# Patient Record
Sex: Female | Born: 1955
Health system: Southern US, Community
[De-identification: ages and names within clinical notes are randomized; demographics above are authoritative.]

## PROBLEM LIST (undated history)

## (undated) DIAGNOSIS — M1990 Unspecified osteoarthritis, unspecified site: Secondary | ICD-10-CM

## (undated) DIAGNOSIS — T7840XA Allergy, unspecified, initial encounter: Secondary | ICD-10-CM

## (undated) DIAGNOSIS — E669 Obesity, unspecified: Secondary | ICD-10-CM

## (undated) DIAGNOSIS — M67431 Ganglion, right wrist: Secondary | ICD-10-CM

## (undated) DIAGNOSIS — F419 Anxiety disorder, unspecified: Secondary | ICD-10-CM

## (undated) DIAGNOSIS — K219 Gastro-esophageal reflux disease without esophagitis: Secondary | ICD-10-CM

## (undated) DIAGNOSIS — I499 Cardiac arrhythmia, unspecified: Secondary | ICD-10-CM

## (undated) DIAGNOSIS — D649 Anemia, unspecified: Secondary | ICD-10-CM

## (undated) DIAGNOSIS — J302 Other seasonal allergic rhinitis: Secondary | ICD-10-CM

## (undated) HISTORY — DX: Obesity, unspecified: E66.9

## (undated) HISTORY — PX: COLON SURGERY: SHX602

## (undated) HISTORY — DX: Unspecified osteoarthritis, unspecified site: M19.90

## (undated) HISTORY — DX: Allergy, unspecified, initial encounter: T78.40XA

## (undated) HISTORY — DX: Cardiac arrhythmia, unspecified: I49.9

## (undated) HISTORY — DX: Gastro-esophageal reflux disease without esophagitis: K21.9

## (undated) HISTORY — PX: DIAGNOSTIC LAPAROSCOPY: SUR761

## (undated) HISTORY — PX: TUBAL LIGATION: SHX77

---

## 1989-11-13 HISTORY — PX: ABDOMINAL HYSTERECTOMY: SHX81

## 1998-07-28 ENCOUNTER — Other Ambulatory Visit: Admission: RE | Admit: 1998-07-28 | Discharge: 1998-07-28 | Payer: Self-pay | Admitting: Gynecology

## 2000-12-27 ENCOUNTER — Encounter: Admission: RE | Admit: 2000-12-27 | Discharge: 2000-12-27 | Payer: Self-pay | Admitting: Family Medicine

## 2000-12-27 ENCOUNTER — Encounter: Payer: Self-pay | Admitting: Family Medicine

## 2004-12-23 ENCOUNTER — Ambulatory Visit (HOSPITAL_COMMUNITY): Admission: RE | Admit: 2004-12-23 | Discharge: 2004-12-23 | Payer: Self-pay | Admitting: Family Medicine

## 2011-12-26 ENCOUNTER — Other Ambulatory Visit: Payer: Self-pay | Admitting: Gastroenterology

## 2011-12-26 DIAGNOSIS — R1012 Left upper quadrant pain: Secondary | ICD-10-CM

## 2011-12-29 ENCOUNTER — Ambulatory Visit
Admission: RE | Admit: 2011-12-29 | Discharge: 2011-12-29 | Disposition: A | Discharge status: Home / Self Care | Payer: BC Managed Care – PPO | Source: Ambulatory Visit | Attending: Gastroenterology | Admitting: Gastroenterology

## 2011-12-29 DIAGNOSIS — R1012 Left upper quadrant pain: Secondary | ICD-10-CM

## 2011-12-29 MED ORDER — IOHEXOL 300 MG/ML  SOLN
100.0000 mL | Freq: Once | INTRAMUSCULAR | Status: AC | PRN
  Administered 2011-12-29: 100 mL via INTRAVENOUS

## 2013-05-05 ENCOUNTER — Other Ambulatory Visit: Payer: Self-pay | Admitting: Family Medicine

## 2013-05-05 DIAGNOSIS — J329 Chronic sinusitis, unspecified: Secondary | ICD-10-CM

## 2013-05-06 ENCOUNTER — Ambulatory Visit
Admission: RE | Admit: 2013-05-06 | Discharge: 2013-05-06 | Disposition: A | Discharge status: Home / Self Care | Payer: BC Managed Care – PPO | Source: Ambulatory Visit | Attending: Family Medicine | Admitting: Family Medicine

## 2013-05-06 DIAGNOSIS — J329 Chronic sinusitis, unspecified: Secondary | ICD-10-CM

## 2013-09-23 ENCOUNTER — Other Ambulatory Visit: Payer: Self-pay | Admitting: Family Medicine

## 2013-09-23 ENCOUNTER — Ambulatory Visit
Admission: RE | Admit: 2013-09-23 | Discharge: 2013-09-23 | Disposition: A | Discharge status: Home / Self Care | Payer: BC Managed Care – PPO | Source: Ambulatory Visit | Attending: Family Medicine | Admitting: Family Medicine

## 2013-09-23 DIAGNOSIS — M79671 Pain in right foot: Secondary | ICD-10-CM

## 2013-09-23 DIAGNOSIS — M79672 Pain in left foot: Secondary | ICD-10-CM

## 2013-12-16 ENCOUNTER — Ambulatory Visit
Admission: RE | Admit: 2013-12-16 | Discharge: 2013-12-16 | Disposition: A | Discharge status: Home / Self Care | Payer: BC Managed Care – PPO | Source: Ambulatory Visit | Attending: Family Medicine | Admitting: Family Medicine

## 2013-12-16 ENCOUNTER — Other Ambulatory Visit: Payer: Self-pay | Admitting: Family Medicine

## 2013-12-16 DIAGNOSIS — K5792 Diverticulitis of intestine, part unspecified, without perforation or abscess without bleeding: Secondary | ICD-10-CM

## 2013-12-16 DIAGNOSIS — R1032 Left lower quadrant pain: Secondary | ICD-10-CM

## 2013-12-16 MED ORDER — IOHEXOL 300 MG/ML  SOLN
100.0000 mL | Freq: Once | INTRAMUSCULAR | Status: AC | PRN
  Administered 2013-12-16: 100 mL via INTRAVENOUS

## 2016-06-07 ENCOUNTER — Other Ambulatory Visit: Payer: Self-pay | Admitting: Otolaryngology

## 2016-06-07 DIAGNOSIS — R430 Anosmia: Secondary | ICD-10-CM

## 2016-06-14 ENCOUNTER — Other Ambulatory Visit: Payer: Self-pay

## 2016-06-17 ENCOUNTER — Other Ambulatory Visit: Payer: Self-pay | Admitting: Otolaryngology

## 2016-06-17 ENCOUNTER — Ambulatory Visit
Admission: RE | Admit: 2016-06-17 | Discharge: 2016-06-17 | Disposition: A | Discharge status: Home / Self Care | Payer: BLUE CROSS/BLUE SHIELD | Source: Ambulatory Visit | Attending: Otolaryngology | Admitting: Otolaryngology

## 2016-06-17 DIAGNOSIS — R430 Anosmia: Secondary | ICD-10-CM

## 2016-09-11 ENCOUNTER — Other Ambulatory Visit: Payer: Self-pay | Admitting: Orthopedic Surgery

## 2016-09-13 ENCOUNTER — Encounter (HOSPITAL_BASED_OUTPATIENT_CLINIC_OR_DEPARTMENT_OTHER): Payer: Self-pay | Admitting: *Deleted

## 2016-09-19 ENCOUNTER — Encounter (HOSPITAL_BASED_OUTPATIENT_CLINIC_OR_DEPARTMENT_OTHER)
Admission: RE | Disposition: A | Discharge status: Home / Self Care | Payer: Self-pay | Source: Ambulatory Visit | Attending: Orthopedic Surgery

## 2016-09-19 ENCOUNTER — Ambulatory Visit (HOSPITAL_BASED_OUTPATIENT_CLINIC_OR_DEPARTMENT_OTHER): Payer: BLUE CROSS/BLUE SHIELD | Admitting: Certified Registered"

## 2016-09-19 ENCOUNTER — Encounter (HOSPITAL_BASED_OUTPATIENT_CLINIC_OR_DEPARTMENT_OTHER): Payer: Self-pay | Admitting: Certified Registered"

## 2016-09-19 ENCOUNTER — Ambulatory Visit (HOSPITAL_BASED_OUTPATIENT_CLINIC_OR_DEPARTMENT_OTHER)
Admission: RE | Admit: 2016-09-19 | Discharge: 2016-09-19 | Disposition: A | Discharge status: Home / Self Care | Payer: BLUE CROSS/BLUE SHIELD | Source: Ambulatory Visit | Attending: Orthopedic Surgery | Admitting: Orthopedic Surgery

## 2016-09-19 DIAGNOSIS — Z880 Allergy status to penicillin: Secondary | ICD-10-CM | POA: Insufficient documentation

## 2016-09-19 DIAGNOSIS — F419 Anxiety disorder, unspecified: Secondary | ICD-10-CM | POA: Diagnosis not present

## 2016-09-19 DIAGNOSIS — M67431 Ganglion, right wrist: Secondary | ICD-10-CM | POA: Insufficient documentation

## 2016-09-19 HISTORY — PX: MASS EXCISION: SHX2000

## 2016-09-19 HISTORY — DX: Other seasonal allergic rhinitis: J30.2

## 2016-09-19 HISTORY — DX: Anemia, unspecified: D64.9

## 2016-09-19 HISTORY — DX: Ganglion, right wrist: M67.431

## 2016-09-19 HISTORY — DX: Anxiety disorder, unspecified: F41.9

## 2016-09-19 SURGERY — EXCISION MASS
Anesthesia: Monitor Anesthesia Care | Site: Wrist | Laterality: Right

## 2016-09-19 MED ORDER — LIDOCAINE 2% (20 MG/ML) 5 ML SYRINGE
INTRAMUSCULAR | Status: AC
  Filled 2016-09-19: qty 5

## 2016-09-19 MED ORDER — LACTATED RINGERS IV SOLN
INTRAVENOUS | Status: DC
  Administered 2016-09-19: 13:00:00 via INTRAVENOUS

## 2016-09-19 MED ORDER — PROMETHAZINE HCL 25 MG/ML IJ SOLN: 6.2500 mg | INTRAMUSCULAR | Status: DC | PRN

## 2016-09-19 MED ORDER — OXYCODONE HCL 5 MG PO TABS: 5.0000 mg | ORAL_TABLET | Freq: Once | ORAL | Status: DC | PRN

## 2016-09-19 MED ORDER — HYDROCODONE-ACETAMINOPHEN 5-325 MG PO TABS: 1.0000 | ORAL_TABLET | Freq: Four times a day (QID) | ORAL | 0 refills | Status: DC | PRN

## 2016-09-19 MED ORDER — ONDANSETRON HCL 4 MG/2ML IJ SOLN
INTRAMUSCULAR | Status: AC
  Filled 2016-09-19: qty 2

## 2016-09-19 MED ORDER — HYDROMORPHONE HCL 1 MG/ML IJ SOLN: 0.2500 mg | INTRAMUSCULAR | Status: DC | PRN

## 2016-09-19 MED ORDER — CEFAZOLIN SODIUM-DEXTROSE 2-4 GM/100ML-% IV SOLN
INTRAVENOUS | Status: AC
  Filled 2016-09-19: qty 100

## 2016-09-19 MED ORDER — SCOPOLAMINE 1 MG/3DAYS TD PT72: 1.0000 | MEDICATED_PATCH | Freq: Once | TRANSDERMAL | Status: DC | PRN

## 2016-09-19 MED ORDER — FENTANYL CITRATE (PF) 100 MCG/2ML IJ SOLN
50.0000 ug | INTRAMUSCULAR | Status: AC | PRN
  Administered 2016-09-19: 25 ug via INTRAVENOUS
  Administered 2016-09-19: 50 ug via INTRAVENOUS
  Administered 2016-09-19: 25 ug via INTRAVENOUS

## 2016-09-19 MED ORDER — FENTANYL CITRATE (PF) 100 MCG/2ML IJ SOLN
INTRAMUSCULAR | Status: AC
  Filled 2016-09-19: qty 2

## 2016-09-19 MED ORDER — MIDAZOLAM HCL 2 MG/2ML IJ SOLN
1.0000 mg | INTRAMUSCULAR | Status: DC | PRN
  Administered 2016-09-19 (×2): 1 mg via INTRAVENOUS

## 2016-09-19 MED ORDER — VANCOMYCIN HCL IN DEXTROSE 1-5 GM/200ML-% IV SOLN
INTRAVENOUS | Status: AC
  Filled 2016-09-19: qty 200

## 2016-09-19 MED ORDER — OXYCODONE HCL 5 MG/5ML PO SOLN: 5.0000 mg | Freq: Once | ORAL | Status: DC | PRN

## 2016-09-19 MED ORDER — CHLORHEXIDINE GLUCONATE 4 % EX LIQD: 60.0000 mL | Freq: Once | CUTANEOUS | Status: DC

## 2016-09-19 MED ORDER — VANCOMYCIN HCL 1000 MG IV SOLR
INTRAVENOUS | Status: DC | PRN
  Administered 2016-09-19: 1000 mg via INTRAVENOUS

## 2016-09-19 MED ORDER — CEFAZOLIN SODIUM-DEXTROSE 2-4 GM/100ML-% IV SOLN: 2.0000 g | INTRAVENOUS | Status: DC

## 2016-09-19 MED ORDER — ONDANSETRON HCL 4 MG/2ML IJ SOLN
INTRAMUSCULAR | Status: DC | PRN
  Administered 2016-09-19: 4 mg via INTRAVENOUS

## 2016-09-19 MED ORDER — BUPIVACAINE HCL (PF) 0.25 % IJ SOLN
INTRAMUSCULAR | Status: DC | PRN
Start: 2016-09-19 — End: 2016-09-19
  Administered 2016-09-19: 4 mL

## 2016-09-19 MED ORDER — LIDOCAINE HCL (PF) 0.5 % IJ SOLN
INTRAMUSCULAR | Status: DC | PRN
  Administered 2016-09-19: 50 mL via INTRAVENOUS

## 2016-09-19 MED ORDER — MIDAZOLAM HCL 2 MG/2ML IJ SOLN
INTRAMUSCULAR | Status: AC
  Filled 2016-09-19: qty 2

## 2016-09-19 SURGICAL SUPPLY — 46 items
BANDAGE COBAN STERILE 2 (GAUZE/BANDAGES/DRESSINGS) IMPLANT
BLADE SURG 15 STRL LF DISP TIS (BLADE) ×1 IMPLANT
BLADE SURG 15 STRL SS (BLADE) ×2
BNDG CMPR 9X4 STRL LF SNTH (GAUZE/BANDAGES/DRESSINGS) ×1
BNDG COHESIVE 1X5 TAN STRL LF (GAUZE/BANDAGES/DRESSINGS) IMPLANT
BNDG COHESIVE 3X5 TAN STRL LF (GAUZE/BANDAGES/DRESSINGS) ×1 IMPLANT
BNDG ESMARK 4X9 LF (GAUZE/BANDAGES/DRESSINGS) ×1 IMPLANT
BNDG GAUZE ELAST 4 BULKY (GAUZE/BANDAGES/DRESSINGS) IMPLANT
CHLORAPREP W/TINT 26ML (MISCELLANEOUS) ×2 IMPLANT
CORDS BIPOLAR (ELECTRODE) ×2 IMPLANT
COVER BACK TABLE 60X90IN (DRAPES) ×2 IMPLANT
COVER MAYO STAND STRL (DRAPES) ×2 IMPLANT
CUFF TOURNIQUET SINGLE 18IN (TOURNIQUET CUFF) IMPLANT
DECANTER SPIKE VIAL GLASS SM (MISCELLANEOUS) IMPLANT
DRAIN PENROSE 1/2X12 LTX STRL (WOUND CARE) IMPLANT
DRAPE EXTREMITY T 121X128X90 (DRAPE) ×2 IMPLANT
DRAPE SURG 17X23 STRL (DRAPES) ×2 IMPLANT
GAUZE SPONGE 4X4 12PLY STRL (GAUZE/BANDAGES/DRESSINGS) ×2 IMPLANT
GAUZE XEROFORM 1X8 LF (GAUZE/BANDAGES/DRESSINGS) ×2 IMPLANT
GLOVE BIOGEL PI IND STRL 8.5 (GLOVE) ×1 IMPLANT
GLOVE BIOGEL PI INDICATOR 8.5 (GLOVE) ×2
GLOVE SURG ORTHO 8.0 STRL STRW (GLOVE) ×2 IMPLANT
GLOVE SURG SYN 7.5  E (GLOVE) ×2
GLOVE SURG SYN 7.5 E (GLOVE) ×2 IMPLANT
GLOVE SURG SYN 7.5 PF PI (GLOVE) IMPLANT
GOWN STRL REUS W/ TWL LRG LVL3 (GOWN DISPOSABLE) ×1 IMPLANT
GOWN STRL REUS W/TWL 2XL LVL3 (GOWN DISPOSABLE) ×1 IMPLANT
GOWN STRL REUS W/TWL LRG LVL3 (GOWN DISPOSABLE) ×2
GOWN STRL REUS W/TWL XL LVL3 (GOWN DISPOSABLE) ×2 IMPLANT
NDL PRECISIONGLIDE 27X1.5 (NEEDLE) IMPLANT
NDL SAFETY ECLIPSE 18X1.5 (NEEDLE) IMPLANT
NEEDLE HYPO 18GX1.5 SHARP (NEEDLE)
NEEDLE PRECISIONGLIDE 27X1.5 (NEEDLE) ×2 IMPLANT
NS IRRIG 1000ML POUR BTL (IV SOLUTION) ×2 IMPLANT
PACK BASIN DAY SURGERY FS (CUSTOM PROCEDURE TRAY) ×2 IMPLANT
PAD CAST 3X4 CTTN HI CHSV (CAST SUPPLIES) IMPLANT
PADDING CAST COTTON 3X4 STRL (CAST SUPPLIES) ×2
SPLINT PLASTER CAST XFAST 3X15 (CAST SUPPLIES) IMPLANT
SPLINT PLASTER XTRA FASTSET 3X (CAST SUPPLIES) ×7
STOCKINETTE 4X48 STRL (DRAPES) ×2 IMPLANT
SUT ETHILON 4 0 PS 2 18 (SUTURE) ×2 IMPLANT
SUT VIC AB 4-0 P2 18 (SUTURE) ×1 IMPLANT
SYR BULB 3OZ (MISCELLANEOUS) ×2 IMPLANT
SYR CONTROL 10ML LL (SYRINGE) ×1 IMPLANT
TOWEL OR 17X24 6PK STRL BLUE (TOWEL DISPOSABLE) ×2 IMPLANT
UNDERPAD 30X30 (UNDERPADS AND DIAPERS) ×2 IMPLANT

## 2016-09-19 NOTE — Anesthesia Postprocedure Evaluation (Signed)
Anesthesia Post Note  Patient: Lori Strickland  Procedure(s) Performed: Procedure(s) (LRB): EXCISION volar cyst right wrist (Right)  Patient location during evaluation: PACU Anesthesia Type: MAC and Bier Block Level of consciousness: awake and alert Pain management: pain level controlled Vital Signs Assessment: post-procedure vital signs reviewed and stable Respiratory status: spontaneous breathing, nonlabored ventilation, respiratory function stable and patient connected to nasal cannula oxygen Cardiovascular status: stable and blood pressure returned to baseline Anesthetic complications: no    Last Vitals:  Vitals:   09/19/16 1500 09/19/16 1515  BP: (!) 113/58 (!) 117/59  Pulse: 62 64  Resp: 14 12  Temp:      Last Pain:  Vitals:   09/19/16 1515  TempSrc:   PainSc: 0-No pain                 Biance Moncrief,JAMES TERRILL

## 2016-09-19 NOTE — Anesthesia Procedure Notes (Signed)
Procedure Name: MAC Date/Time: 09/19/2016 2:06 PM Performed by: Curly ShoresRAFT, Oanh Devivo W Pre-anesthesia Checklist: Patient identified, Emergency Drugs available, Suction available and Patient being monitored Patient Re-evaluated:Patient Re-evaluated prior to inductionOxygen Delivery Method: Simple face mask Preoxygenation: Pre-oxygenation with 100% oxygen Intubation Type: IV induction

## 2016-09-19 NOTE — Op Note (Signed)
Dictation Number [id number]

## 2016-09-19 NOTE — Discharge Instructions (Addendum)

## 2016-09-19 NOTE — H&P (Signed)
  Lori Strickland is an 60 y.o. female.   Chief Complaint: mass right wrist HPI: Lori Strickland is a 60 year old right-hand-dominant female referred by Dr. Cliffton AstersWhite for consultation regarding a mass in the volar radial aspect of her right wrist. She states this been present for 3 months. She states she recalls no history of injury to it. She states that she has been playing a can set of a moderate time out of computer game and this seems to aggravate it. She otherwise has no pain. She states that it is an aggravation more than discomfort. She has not had any treatment or tried anything for this. She states that she notices she frequently rubs the area. She has no history of diabetes thyroid problems arthritis gout. She is borderline diabetic. Family history is negative for diabetes positive for thyroid problems negative for arthritis negative for gout.      Past Medical History:  Diagnosis Date  . Anemia   . Anxiety   . Ganglion cyst of wrist, right   . Seasonal allergies     Past Surgical History:  Procedure Laterality Date  . ABDOMINAL HYSTERECTOMY  1991  . COLON SURGERY    . DIAGNOSTIC LAPAROSCOPY     cysts on ovaries  . TUBAL LIGATION      History reviewed. No pertinent family history. Social History:  reports that she has never smoked. She has never used smokeless tobacco. She reports that she does not drink alcohol or use drugs.  Allergies:  Allergies  Allergen Reactions  . Eggs Or Egg-Derived Products Swelling  . Ampicillin Itching  . Erythromycin Diarrhea    No prescriptions prior to admission.    No results found for this or any previous visit (from the past 48 hour(s)).  No results found.   Pertinent items are noted in HPI.  Height 5\' 6"  (1.676 m), weight 81.6 kg (180 lb).  General appearance: alert, cooperative and appears stated age Head: Normocephalic, without obvious abnormality Neck: no JVD Resp: clear to auscultation bilaterally Cardio: regular rate and rhythm,  S1, S2 normal, no murmur, click, rub or gallop GI: soft, non-tender; bowel sounds normal; no masses,  no organomegaly Extremities: mass right wrist Pulses: 2+ and symmetric Skin: Skin color, texture, turgor normal. No rashes or lesions Neurologic: Grossly normal Incision/Wound: na  Assessment/Plan Assessment:  1. Ganglion cyst of volar aspect of right wrist    Plan: We have discussed various treatment alternatives with her. We have discussed splinting we have discussed discussed anti-inflammatories injection or surgical excision pre peri and postoperative course are discussed along with risks and complications. She is aware that there is no guarantee to the surgery the possibility of infection recurrence injury to arteries nerves tendons and incomplete relief of symptoms and dystrophy. She is aware of a 20% recurrence rate on the palmar aspect. Is requested to proceed to have this surgically excised. Dr. Lucilla LameWhite's notes are reviewed. She is scheduled for excision volar radial wrist ganglion as an outpatient under regional general anesthesia was dictated by anesthesia.      Savannah Erbe R 09/19/2016, 12:23 PM

## 2016-09-19 NOTE — Anesthesia Preprocedure Evaluation (Addendum)
Anesthesia Evaluation  Patient identified by MRN, date of birth, ID band Patient awake    Reviewed: Allergy & Precautions, NPO status , Patient's Chart, lab work & pertinent test results  History of Anesthesia Complications Negative for: history of anesthetic complications  Airway Mallampati: II  TM Distance: >3 FB Neck ROM: Full    Dental  (+) Teeth Intact   Pulmonary neg pulmonary ROS,    breath sounds clear to auscultation       Cardiovascular negative cardio ROS   Rhythm:Regular Rate:Normal     Neuro/Psych negative neurological ROS     GI/Hepatic negative GI ROS, Neg liver ROS,   Endo/Other  negative endocrine ROS  Renal/GU negative Renal ROS     Musculoskeletal negative musculoskeletal ROS (+)   Abdominal   Peds  Hematology negative hematology ROS (+)   Anesthesia Other Findings   Reproductive/Obstetrics                            Anesthesia Physical Anesthesia Plan  ASA: I  Anesthesia Plan: MAC and Bier Block   Post-op Pain Management:    Induction: Intravenous  Airway Management Planned: Simple Face Mask and Natural Airway  Additional Equipment:   Intra-op Plan:   Post-operative Plan:   Informed Consent: I have reviewed the patients History and Physical, chart, labs and discussed the procedure including the risks, benefits and alternatives for the proposed anesthesia with the patient or authorized representative who has indicated his/her understanding and acceptance.     Plan Discussed with: CRNA  Anesthesia Plan Comments:        Anesthesia Quick Evaluation

## 2016-09-19 NOTE — Transfer of Care (Signed)
Immediate Anesthesia Transfer of Care Note  Patient: Lori Strickland  Procedure(s) Performed: Procedure(s): EXCISION volar cyst right wrist (Right)  Patient Location: PACU  Anesthesia Type:MAC and Bier block  Level of Consciousness: awake, alert , oriented and patient cooperative  Airway & Oxygen Therapy: Patient Spontanous Breathing and Patient connected to face mask oxygen  Post-op Assessment: Report given to RN, Post -op Vital signs reviewed and stable and Patient moving all extremities  Post vital signs: Reviewed and stable  Last Vitals:  Vitals:   09/19/16 1449 09/19/16 1450  BP: 118/73   Pulse: 64 64  Resp:  12  Temp:      Last Pain:  Vitals:   09/19/16 1310  TempSrc: Oral         Complications: No apparent anesthesia complications

## 2016-09-19 NOTE — H&P (Signed)
  Lori Strickland is an 60 y.o. female.   Chief Complaint: mass right wrist WNU:[id number]:Lori Strickland is a 60 year old right-hand-dominant female referred by Dr. Cliffton Strickland for consultation regarding a mass in the volar radial aspect of her right wrist. She states this been present for 3 months. She states she recalls no history of injury to it. She states that she has been playing a can set of a moderate time out of computer game and this seems to aggravate it. She otherwise has no pain. She states that it is an aggravation more than discomfort. She has not had any treatment or tried anything for this. She states that she notices she frequently rubs the area. She has no history of diabetes thyroid problems arthritis gout. She is borderline diabetic. Family history is negative for diabetes positive for thyroid problems negative for arthritis negative for gout.      Past Medical History:  Diagnosis Date  . Anemia   . Anxiety   . Ganglion cyst of wrist, right   . Seasonal allergies     Past Surgical History:  Procedure Laterality Date  . ABDOMINAL HYSTERECTOMY  1991  . COLON SURGERY    . DIAGNOSTIC LAPAROSCOPY     cysts on ovaries  . TUBAL LIGATION      History reviewed. No pertinent family history. Social History:  reports that she has never smoked. She has never used smokeless tobacco. She reports that she does not drink alcohol or use drugs.  Allergies:  Allergies  Allergen Reactions  . Eggs Or Egg-Derived Products Swelling  . Ampicillin Itching  . Erythromycin Diarrhea    No prescriptions prior to admission.    No results found for this or any previous visit (from the past 48 hour(s)).  No results found.   Pertinent items are noted in HPI.  Height 5\' 6"  (1.676 m), weight 81.6 kg (180 lb).  General appearance: alert, cooperative and appears stated age Head: Normocephalic, without obvious abnormality Neck: no JVD Resp: clear to auscultation bilaterally Cardio: regular rate and rhythm,  S1, S2 normal, no murmur, click, rub or gallop GI: soft, non-tender; bowel sounds normal; no masses,  no organomegaly Extremities: mass right wrist Pulses: 2+ and symmetric Skin: Skin color, texture, turgor normal. No rashes or lesions Neurologic: Grossly normal Incision/Wound: na  Assessment/Plan Assessment:  1. Ganglion cyst of volar aspect of right wrist l    Plan: We have discussed various treatment alternatives with her. We have discussed splinting we have discussed discussed anti-inflammatories injection or surgical excision pre peri and postoperative course are discussed along with risks and complications. She is aware that there is no guarantee to the surgery the possibility of infection recurrence injury to arteries nerves tendons and incomplete relief of symptoms and dystrophy. She is aware of a 20% recurrence rate on the palmar aspect. Is requested to proceed to have this surgically excised. Dr. Lucilla LameWhite's notes are reviewed. She is scheduled for excision volar radial wrist ganglion as an outpatient under regional general anesthesia was dictated by anesthesia.      Lori Strickland R 09/19/2016, 11:18 AM

## 2016-09-19 NOTE — Brief Op Note (Signed)
09/19/2016  2:47 PM  PATIENT:  Lori Strickland  60 y.o. female  PRE-OPERATIVE DIAGNOSIS:  volar cyst right wrist  POST-OPERATIVE DIAGNOSIS:  volar cyst right wrist  PROCEDURE:  Procedure(s): EXCISION volar cyst right wrist (Right)  SURGEON:  Surgeon(s) and Role:    * Cindee SaltGary Sophiamarie Nease, MD - Primary  PHYSICIAN ASSISTANT:   ASSISTANTS: none   ANESTHESIA:   regional and local  EBL:  Total I/O In: 500 [I.V.:500] Out: 3 [Blood:3]  BLOOD ADMINISTERED:none  DRAINS: none   LOCAL MEDICATIONS USED:  BUPIVICAINE   SPECIMEN:  Excision  DISPOSITION OF SPECIMEN:  PATHOLOGY  COUNTS:  YES  TOURNIQUET:   Total Tourniquet Time Documented: Upper Arm (Right) - 31 minutes Total: Upper Arm (Right) - 31 minutes   DICTATION: .Other Dictation: Dictation Number [id number]  PLAN OF CARE: Discharge to home after PACU  PATIENT DISPOSITION:  PACU - hemodynamically stable.

## 2016-09-20 ENCOUNTER — Encounter (HOSPITAL_BASED_OUTPATIENT_CLINIC_OR_DEPARTMENT_OTHER): Payer: Self-pay | Admitting: Orthopedic Surgery

## 2016-09-20 NOTE — Op Note (Signed)
NAMMurlean Hark:  Engebretson, ELLEN                 ACCOUNT NO.:  [id number]  MEDICAL RECORD NO.:  [id number]  LOCATION:                                 FACILITY:  PHYSICIAN:  Cindee SaltGary Beverlee Wilmarth, M.D.            DATE OF BIRTH:  DATE OF PROCEDURE:  09/19/2016 DATE OF DISCHARGE:                              OPERATIVE REPORT   PREOPERATIVE DIAGNOSIS:  Volar radial wrist ganglion, right wrist.  POSTOPERATIVE DIAGNOSIS:  Volar radial wrist ganglion, right wrist.  OPERATION:  Excision volar radial wrist ganglion, right wrist.  SURGEON:  Cindee SaltGary Kendricks Reap, M.D.  ANESTHESIA:  Upper arm IV regional with local infiltration.  ANESTHESIOLOGIST:  Burna FortsJames T. Massagee, M.D.  HISTORY:  The patient is a 60 year old female with a history of a mass on the volar radial aspect of the right wrist which she desires having removed.  She is aware of risks and complications including infection; recurrence of injury to arteries, nerves, tendons; incomplete relief of symptoms and dystrophy.  In the preoperative area, the patient is seen, the extremity marked by both patient and surgeon, and antibiotic given.  PROCEDURE IN DETAIL:  The patient was brought to the operating room, where an upper arm IV regional anesthetic was carried out without difficulty under the direction of Dr. Jacklynn BueMassagee.  She was prepped using ChloraPrep in a supine position with the right arm free.  A 3-minute dry time was allowed and time-out taken, confirming the patient and procedure.  The incision was made on the volar radial aspect of the right wrist and carried down through subcutaneous tissue.  Bleeders were electrocauterized with bipolar.  The radial artery was identified with a cyst in the interval between the proper radial artery and the volar branch.  With blunt and sharp dissection, this was carefully dissected free.  Bleeders were electrocauterized as necessary with bipolar. Primarily, these were vena comitans.  The cyst was followed into  the radiocarpal joint.  This area was opened.  The entire cyst was sent to Pathology.  The area was coagulated where the cyst arose.  The wound was copiously irrigated with saline.  The subcutaneous tissue was closed with interrupted 4-0 Vicryl sutures.  The skin was then closed with interrupted 4-0 nylon sutures.  A local infiltration with 0.25% bupivacaine without epinephrine was given, approximately 5 mL was used.  A sterile compressive dressing and volar splint were applied.  On deflation of the tourniquet, all fingers immediately pinked.  She was taken to the recovery room for observation in satisfactory condition. She will be discharged home [facility].          ______________________________ Cindee SaltGary Tanishi Nault, M.D.     GK/MEDQ  D:  09/19/2016  T:  09/20/2016  Job:  [id number]

## 2017-06-08 ENCOUNTER — Other Ambulatory Visit: Payer: Self-pay | Admitting: Family Medicine

## 2017-06-08 ENCOUNTER — Ambulatory Visit
Admission: RE | Admit: 2017-06-08 | Discharge: 2017-06-08 | Disposition: A | Discharge status: Home / Self Care | Payer: BLUE CROSS/BLUE SHIELD | Source: Ambulatory Visit | Attending: Family Medicine | Admitting: Family Medicine

## 2017-06-08 DIAGNOSIS — M79671 Pain in right foot: Secondary | ICD-10-CM

## 2017-12-11 DIAGNOSIS — J069 Acute upper respiratory infection, unspecified: Secondary | ICD-10-CM | POA: Diagnosis not present

## 2017-12-11 DIAGNOSIS — M791 Myalgia, unspecified site: Secondary | ICD-10-CM | POA: Diagnosis not present

## 2017-12-11 DIAGNOSIS — R05 Cough: Secondary | ICD-10-CM | POA: Diagnosis not present

## 2018-01-03 DIAGNOSIS — Z0000 Encounter for general adult medical examination without abnormal findings: Secondary | ICD-10-CM | POA: Diagnosis not present

## 2018-01-03 DIAGNOSIS — Z13220 Encounter for screening for lipoid disorders: Secondary | ICD-10-CM | POA: Diagnosis not present

## 2018-01-03 DIAGNOSIS — D509 Iron deficiency anemia, unspecified: Secondary | ICD-10-CM | POA: Diagnosis not present

## 2018-01-03 DIAGNOSIS — R7303 Prediabetes: Secondary | ICD-10-CM | POA: Diagnosis not present

## 2018-01-03 DIAGNOSIS — Z1159 Encounter for screening for other viral diseases: Secondary | ICD-10-CM | POA: Diagnosis not present

## 2018-01-03 DIAGNOSIS — E559 Vitamin D deficiency, unspecified: Secondary | ICD-10-CM | POA: Diagnosis not present

## 2018-01-31 DIAGNOSIS — F900 Attention-deficit hyperactivity disorder, predominantly inattentive type: Secondary | ICD-10-CM | POA: Diagnosis not present

## 2018-01-31 DIAGNOSIS — F419 Anxiety disorder, unspecified: Secondary | ICD-10-CM | POA: Diagnosis not present

## 2018-02-22 DIAGNOSIS — F900 Attention-deficit hyperactivity disorder, predominantly inattentive type: Secondary | ICD-10-CM | POA: Diagnosis not present

## 2018-04-16 DIAGNOSIS — F900 Attention-deficit hyperactivity disorder, predominantly inattentive type: Secondary | ICD-10-CM | POA: Diagnosis not present

## 2018-04-16 DIAGNOSIS — M19071 Primary osteoarthritis, right ankle and foot: Secondary | ICD-10-CM | POA: Diagnosis not present

## 2018-05-03 DIAGNOSIS — M79671 Pain in right foot: Secondary | ICD-10-CM | POA: Diagnosis not present

## 2018-05-06 DIAGNOSIS — M79671 Pain in right foot: Secondary | ICD-10-CM | POA: Diagnosis not present

## 2018-05-09 DIAGNOSIS — R202 Paresthesia of skin: Secondary | ICD-10-CM | POA: Diagnosis not present

## 2018-05-09 DIAGNOSIS — M79671 Pain in right foot: Secondary | ICD-10-CM | POA: Diagnosis not present

## 2018-05-24 DIAGNOSIS — M79671 Pain in right foot: Secondary | ICD-10-CM | POA: Diagnosis not present

## 2018-05-29 DIAGNOSIS — M79671 Pain in right foot: Secondary | ICD-10-CM | POA: Diagnosis not present

## 2018-05-30 DIAGNOSIS — M5136 Other intervertebral disc degeneration, lumbar region: Secondary | ICD-10-CM | POA: Diagnosis not present

## 2018-05-30 DIAGNOSIS — M4316 Spondylolisthesis, lumbar region: Secondary | ICD-10-CM | POA: Diagnosis not present

## 2018-05-31 DIAGNOSIS — M79671 Pain in right foot: Secondary | ICD-10-CM | POA: Diagnosis not present

## 2018-06-04 DIAGNOSIS — M79671 Pain in right foot: Secondary | ICD-10-CM | POA: Diagnosis not present

## 2018-06-07 DIAGNOSIS — M79671 Pain in right foot: Secondary | ICD-10-CM | POA: Diagnosis not present

## 2018-06-10 DIAGNOSIS — M545 Low back pain: Secondary | ICD-10-CM | POA: Diagnosis not present

## 2018-06-11 DIAGNOSIS — M79671 Pain in right foot: Secondary | ICD-10-CM | POA: Diagnosis not present

## 2018-06-14 DIAGNOSIS — M4316 Spondylolisthesis, lumbar region: Secondary | ICD-10-CM | POA: Diagnosis not present

## 2018-06-14 DIAGNOSIS — M79671 Pain in right foot: Secondary | ICD-10-CM | POA: Diagnosis not present

## 2018-06-14 DIAGNOSIS — M5136 Other intervertebral disc degeneration, lumbar region: Secondary | ICD-10-CM | POA: Diagnosis not present

## 2018-06-17 DIAGNOSIS — M79671 Pain in right foot: Secondary | ICD-10-CM | POA: Diagnosis not present

## 2018-06-20 DIAGNOSIS — M79671 Pain in right foot: Secondary | ICD-10-CM | POA: Diagnosis not present

## 2018-06-24 DIAGNOSIS — M79671 Pain in right foot: Secondary | ICD-10-CM | POA: Diagnosis not present

## 2018-06-27 DIAGNOSIS — M79671 Pain in right foot: Secondary | ICD-10-CM | POA: Diagnosis not present

## 2018-07-04 DIAGNOSIS — M79671 Pain in right foot: Secondary | ICD-10-CM | POA: Diagnosis not present

## 2018-07-11 DIAGNOSIS — M79671 Pain in right foot: Secondary | ICD-10-CM | POA: Diagnosis not present

## 2018-07-18 DIAGNOSIS — M79671 Pain in right foot: Secondary | ICD-10-CM | POA: Diagnosis not present

## 2018-07-25 DIAGNOSIS — M79671 Pain in right foot: Secondary | ICD-10-CM | POA: Diagnosis not present

## 2018-07-31 DIAGNOSIS — M9902 Segmental and somatic dysfunction of thoracic region: Secondary | ICD-10-CM | POA: Diagnosis not present

## 2018-07-31 DIAGNOSIS — M9901 Segmental and somatic dysfunction of cervical region: Secondary | ICD-10-CM | POA: Diagnosis not present

## 2018-07-31 DIAGNOSIS — M62838 Other muscle spasm: Secondary | ICD-10-CM | POA: Diagnosis not present

## 2018-07-31 DIAGNOSIS — M542 Cervicalgia: Secondary | ICD-10-CM | POA: Diagnosis not present

## 2018-08-01 DIAGNOSIS — M542 Cervicalgia: Secondary | ICD-10-CM | POA: Diagnosis not present

## 2018-08-01 DIAGNOSIS — M9902 Segmental and somatic dysfunction of thoracic region: Secondary | ICD-10-CM | POA: Diagnosis not present

## 2018-08-01 DIAGNOSIS — M9901 Segmental and somatic dysfunction of cervical region: Secondary | ICD-10-CM | POA: Diagnosis not present

## 2018-08-01 DIAGNOSIS — M62838 Other muscle spasm: Secondary | ICD-10-CM | POA: Diagnosis not present

## 2018-08-05 DIAGNOSIS — M62838 Other muscle spasm: Secondary | ICD-10-CM | POA: Diagnosis not present

## 2018-08-05 DIAGNOSIS — M9901 Segmental and somatic dysfunction of cervical region: Secondary | ICD-10-CM | POA: Diagnosis not present

## 2018-08-05 DIAGNOSIS — M9902 Segmental and somatic dysfunction of thoracic region: Secondary | ICD-10-CM | POA: Diagnosis not present

## 2018-08-05 DIAGNOSIS — M542 Cervicalgia: Secondary | ICD-10-CM | POA: Diagnosis not present

## 2018-08-06 DIAGNOSIS — M9901 Segmental and somatic dysfunction of cervical region: Secondary | ICD-10-CM | POA: Diagnosis not present

## 2018-08-06 DIAGNOSIS — M62838 Other muscle spasm: Secondary | ICD-10-CM | POA: Diagnosis not present

## 2018-08-06 DIAGNOSIS — M9902 Segmental and somatic dysfunction of thoracic region: Secondary | ICD-10-CM | POA: Diagnosis not present

## 2018-08-06 DIAGNOSIS — M542 Cervicalgia: Secondary | ICD-10-CM | POA: Diagnosis not present

## 2018-08-08 DIAGNOSIS — M9902 Segmental and somatic dysfunction of thoracic region: Secondary | ICD-10-CM | POA: Diagnosis not present

## 2018-08-08 DIAGNOSIS — M62838 Other muscle spasm: Secondary | ICD-10-CM | POA: Diagnosis not present

## 2018-08-08 DIAGNOSIS — M542 Cervicalgia: Secondary | ICD-10-CM | POA: Diagnosis not present

## 2018-08-08 DIAGNOSIS — M9901 Segmental and somatic dysfunction of cervical region: Secondary | ICD-10-CM | POA: Diagnosis not present

## 2018-08-12 DIAGNOSIS — M9902 Segmental and somatic dysfunction of thoracic region: Secondary | ICD-10-CM | POA: Diagnosis not present

## 2018-08-12 DIAGNOSIS — M62838 Other muscle spasm: Secondary | ICD-10-CM | POA: Diagnosis not present

## 2018-08-12 DIAGNOSIS — M542 Cervicalgia: Secondary | ICD-10-CM | POA: Diagnosis not present

## 2018-08-12 DIAGNOSIS — M9901 Segmental and somatic dysfunction of cervical region: Secondary | ICD-10-CM | POA: Diagnosis not present

## 2018-08-16 DIAGNOSIS — M9902 Segmental and somatic dysfunction of thoracic region: Secondary | ICD-10-CM | POA: Diagnosis not present

## 2018-08-16 DIAGNOSIS — M9901 Segmental and somatic dysfunction of cervical region: Secondary | ICD-10-CM | POA: Diagnosis not present

## 2018-08-16 DIAGNOSIS — M62838 Other muscle spasm: Secondary | ICD-10-CM | POA: Diagnosis not present

## 2018-08-16 DIAGNOSIS — M542 Cervicalgia: Secondary | ICD-10-CM | POA: Diagnosis not present

## 2018-09-05 ENCOUNTER — Other Ambulatory Visit: Payer: Self-pay | Admitting: Family Medicine

## 2018-09-05 ENCOUNTER — Ambulatory Visit
Admission: RE | Admit: 2018-09-05 | Discharge: 2018-09-05 | Disposition: A | Discharge status: Home / Self Care | Payer: BLUE CROSS/BLUE SHIELD | Source: Ambulatory Visit | Attending: Family Medicine | Admitting: Family Medicine

## 2018-09-05 DIAGNOSIS — M436 Torticollis: Secondary | ICD-10-CM | POA: Diagnosis not present

## 2018-09-05 DIAGNOSIS — Z23 Encounter for immunization: Secondary | ICD-10-CM | POA: Diagnosis not present

## 2018-09-05 DIAGNOSIS — M47812 Spondylosis without myelopathy or radiculopathy, cervical region: Secondary | ICD-10-CM | POA: Diagnosis not present

## 2018-09-23 DIAGNOSIS — J3081 Allergic rhinitis due to animal (cat) (dog) hair and dander: Secondary | ICD-10-CM | POA: Diagnosis not present

## 2018-09-23 DIAGNOSIS — J301 Allergic rhinitis due to pollen: Secondary | ICD-10-CM | POA: Diagnosis not present

## 2018-09-23 DIAGNOSIS — T781XXD Other adverse food reactions, not elsewhere classified, subsequent encounter: Secondary | ICD-10-CM | POA: Diagnosis not present

## 2018-09-23 DIAGNOSIS — Z91012 Allergy to eggs: Secondary | ICD-10-CM | POA: Diagnosis not present

## 2019-01-06 DIAGNOSIS — Z13220 Encounter for screening for lipoid disorders: Secondary | ICD-10-CM | POA: Diagnosis not present

## 2019-01-06 DIAGNOSIS — R7303 Prediabetes: Secondary | ICD-10-CM | POA: Diagnosis not present

## 2019-01-06 DIAGNOSIS — Z23 Encounter for immunization: Secondary | ICD-10-CM | POA: Diagnosis not present

## 2019-01-06 DIAGNOSIS — E559 Vitamin D deficiency, unspecified: Secondary | ICD-10-CM | POA: Diagnosis not present

## 2019-01-06 DIAGNOSIS — Z0000 Encounter for general adult medical examination without abnormal findings: Secondary | ICD-10-CM | POA: Diagnosis not present

## 2019-03-03 DIAGNOSIS — F900 Attention-deficit hyperactivity disorder, predominantly inattentive type: Secondary | ICD-10-CM | POA: Diagnosis not present

## 2019-04-02 DIAGNOSIS — F900 Attention-deficit hyperactivity disorder, predominantly inattentive type: Secondary | ICD-10-CM | POA: Diagnosis not present

## 2019-07-09 ENCOUNTER — Encounter (HOSPITAL_BASED_OUTPATIENT_CLINIC_OR_DEPARTMENT_OTHER): Payer: Self-pay | Admitting: Emergency Medicine

## 2019-07-09 ENCOUNTER — Emergency Department (HOSPITAL_BASED_OUTPATIENT_CLINIC_OR_DEPARTMENT_OTHER)
Admission: EM | Admit: 2019-07-09 | Discharge: 2019-07-10 | Disposition: A | Discharge status: Home / Self Care | Payer: BC Managed Care – PPO | Attending: Emergency Medicine | Admitting: Emergency Medicine

## 2019-07-09 ENCOUNTER — Other Ambulatory Visit: Payer: Self-pay

## 2019-07-09 DIAGNOSIS — M546 Pain in thoracic spine: Secondary | ICD-10-CM | POA: Diagnosis not present

## 2019-07-09 DIAGNOSIS — Z79899 Other long term (current) drug therapy: Secondary | ICD-10-CM | POA: Diagnosis not present

## 2019-07-09 DIAGNOSIS — M62838 Other muscle spasm: Secondary | ICD-10-CM

## 2019-07-09 DIAGNOSIS — M79601 Pain in right arm: Secondary | ICD-10-CM | POA: Diagnosis not present

## 2019-07-09 NOTE — ED Triage Notes (Signed)
Pt is c/o sharp pain in her right upper arm that started about an hour to hour and a half ago  Pt states the pain comes and goes  States she took some aspirin about 10pm

## 2019-07-10 ENCOUNTER — Encounter (HOSPITAL_BASED_OUTPATIENT_CLINIC_OR_DEPARTMENT_OTHER): Payer: Self-pay | Admitting: Emergency Medicine

## 2019-07-10 MED ORDER — METHOCARBAMOL 500 MG PO TABS: 500.0000 mg | ORAL_TABLET | Freq: Two times a day (BID) | ORAL | 0 refills | Status: DC

## 2019-07-10 MED ORDER — KETOROLAC TROMETHAMINE 60 MG/2ML IM SOLN
30.0000 mg | Freq: Once | INTRAMUSCULAR | Status: AC
  Administered 2019-07-10: 30 mg via INTRAMUSCULAR
  Filled 2019-07-10: qty 2

## 2019-07-10 MED ORDER — ACETAMINOPHEN 500 MG PO TABS
1000.0000 mg | ORAL_TABLET | Freq: Once | ORAL | Status: AC
  Administered 2019-07-10: 1000 mg via ORAL
  Filled 2019-07-10: qty 2

## 2019-07-10 MED ORDER — METHOCARBAMOL 500 MG PO TABS
1000.0000 mg | ORAL_TABLET | Freq: Once | ORAL | Status: AC
  Administered 2019-07-10: 1000 mg via ORAL
  Filled 2019-07-10: qty 2

## 2019-07-10 NOTE — ED Provider Notes (Signed)
[facility] EMERGENCY DEPARTMENT Provider Note   CSN: [id number] Arrival date & time: 07/09/19  2306     History   Chief Complaint Chief Complaint  Patient presents with  . Arm Pain    HPI Lori Strickland is a 63 y.o. female.     The history is provided by the patient.  Arm Pain This is a new problem. The current episode started 6 to 12 hours ago. The problem occurs every several days. The problem has not changed since onset.Pertinent negatives include no chest pain, no abdominal pain, no headaches and no shortness of breath. Nothing aggravates the symptoms. Nothing relieves the symptoms. She has tried nothing for the symptoms. The treatment provided no relief.  Pain in the right arm after pulling a heavy cart and raking.  Pain is spasmotic and lasts seconds.  No f/c/r.  No weakness nor numbness.  No CP SOB n/v/d.  Past Medical History:  Diagnosis Date  . Anemia   . Anxiety   . Ganglion cyst of wrist, right   . Seasonal allergies     There are no active problems to display for this patient.   Past Surgical History:  Procedure Laterality Date  . ABDOMINAL HYSTERECTOMY  1991  . COLON SURGERY    . DIAGNOSTIC LAPAROSCOPY     cysts on ovaries  . MASS EXCISION Right 09/19/2016   Procedure: EXCISION volar cyst right wrist;  Surgeon: Cindee Salt, MD;  Location:  SURGERY CENTER;  Service: Orthopedics;  Laterality: Right;  . TUBAL LIGATION       OB History   No obstetric history on file.      Home Medications    Prior to Admission medications   Medication Sig Start Date End Date Taking? Authorizing Provider  b complex vitamins tablet Take 1 tablet by mouth daily.    [provider]  calcium carbonate (OS-CAL - DOSED IN MG OF ELEMENTAL CALCIUM) 1250 (500 Ca) MG tablet Take 1 tablet by mouth.    [provider]  cholecalciferol (VITAMIN D) 1000 units tablet Take 1,000 Units by mouth daily.    [provider]  ferrous sulfate  325 (65 FE) MG EC tablet Take 325 mg by mouth 3 (three) times daily with meals.    [provider]  HYDROcodone-acetaminophen (NORCO) 5-325 MG tablet Take 1 tablet by mouth every 6 (six) hours as needed for moderate pain. 09/19/16   Cindee Salt, MD  loratadine-pseudoephedrine (CLARITIN-D 24-HOUR) 10-240 MG 24 hr tablet Take 1 tablet by mouth daily.    [provider]  Multiple Vitamin (MULTIVITAMIN WITH MINERALS) TABS tablet Take 1 tablet by mouth daily.    [provider]  Omega-3 Fatty Acids (FISH OIL) 1000 MG CAPS Take by mouth.    [provider]  vitamin E 400 UNIT capsule Take 400 Units by mouth daily.    [provider]    Family History Family History  Problem Relation Age of Onset  . Thyroid disease Mother   . Hypertension Father   . CAD Father     Social History Social History   Tobacco Use  . Smoking status: Never Smoker  . Smokeless tobacco: Never Used  Substance Use Topics  . Alcohol use: No  . Drug use: No     Allergies   Eggs or egg-derived products, Ampicillin, and Erythromycin   Review of Systems Review of Systems  Constitutional: Negative for fever.  HENT: Negative for congestion.   Eyes:  Negative for visual disturbance.  Respiratory: Negative for chest tightness and shortness of breath.   Cardiovascular: Negative for chest pain.  Gastrointestinal: Negative for abdominal pain.  Endocrine: Negative for polyuria.  Genitourinary: Negative for difficulty urinating.  Musculoskeletal: Positive for arthralgias. Negative for joint swelling.  Neurological: Negative for weakness, numbness and headaches.  Psychiatric/Behavioral: Negative for agitation.  All other systems reviewed and are negative.    Physical Exam Updated Vital Signs BP 131/67 (BP Location: Left Arm)   Pulse 71   Temp 97.9 F (36.6 C) (Oral)   Resp 20   Ht 5\' 6"  (1.676 m)   Wt 83 kg   SpO2 95%   BMI 29.54 kg/m   Physical Exam Vitals  signs and nursing note reviewed.  Constitutional:      General: She is not in acute distress.    Appearance: She is normal weight.  HENT:     Head: Normocephalic and atraumatic.     Nose: Nose normal.  Eyes:     Conjunctiva/sclera: Conjunctivae normal.     Pupils: Pupils are equal, round, and reactive to light.  Neck:     Musculoskeletal: Normal range of motion and neck supple.  Cardiovascular:     Rate and Rhythm: Normal rate and regular rhythm.     Pulses: Normal pulses.     Heart sounds: Normal heart sounds.  Pulmonary:     Effort: Pulmonary effort is normal.     Breath sounds: Normal breath sounds.  Abdominal:     General: Abdomen is flat. Bowel sounds are normal.     Tenderness: There is no abdominal tenderness. There is no guarding or rebound.  Musculoskeletal: Normal range of motion.        General: No tenderness or signs of injury.     Right shoulder: Normal.     Right elbow: Normal.    Right wrist: Normal.     Right upper arm: Normal.     Right hand: Normal. She exhibits normal capillary refill. Normal sensation noted. Normal strength noted.     Left lower leg: No edema.  Neurological:     Mental Status: She is alert.      ED Treatments / Results  Labs (all labs ordered are listed, but only abnormal results are displayed) Labs Reviewed - No data to display  EKG EKG Interpretation  Date/Time:  Thursday July 10 2019 00:11:54 EDT Ventricular Rate:  64 PR Interval:    QRS Duration: 94 QT Interval:  431 QTC Calculation: 445 R Axis:   54 Text Interpretation:  Sinus rhythm Confirmed by Nicanor AlconPalumbo, Alainah Phang ([id number]) on 07/10/2019 12:23:50 AM   Radiology No results found.  Procedures Procedures (including critical care time)  Medications Ordered in ED Medications  ketorolac (TORADOL) injection 30 mg (has no administration in time range)  methocarbamol (ROBAXIN) tablet 1,000 mg (has no administration in time range)  acetaminophen (TYLENOL) tablet 1,000 mg (has  no administration in time range)    MSK pain, spasm.  ICE elevation and NSAIDs.  This is not cardiac based on duration and worse with movement.  EKG is normal.  Lori Strickland was evaluated in Emergency Department on 07/10/2019 for the symptoms described in the history of present illness. She was evaluated in the context of the global COVID-19 pandemic, which necessitated consideration that the patient might be at risk for infection with the SARS-CoV-2 virus that causes COVID-19. Institutional protocols and algorithms that pertain to the evaluation of patients at risk for COVID-19  are in a state of rapid change based on information released by regulatory bodies including the CDC and federal and state organizations. These policies and algorithms were followed during the patient's care in the ED.   Final Clinical Impressions(s) / ED Diagnoses   Return for intractable cough, coughing up blood,fevers >100.4 unrelieved by medication, shortness of breath, intractable vomiting, chest pain, shortness of breath, weakness,numbness, changes in speech, facial asymmetry,abdominal pain, passing out,Inability to tolerate liquids or food, cough, altered mental status or any concerns. No signs of systemic illness or infection. The patient is nontoxic-appearing on exam and vital signs are within normal limits.   I have reviewed the triage vital signs and the nursing notes. Pertinent labs &imaging results that were available during my care of the patient were reviewed by me and considered in my medical decision making (see chart for details).  After history, exam, and medical workup I feel the patient has been appropriately medically screened and is safe for discharge home. Pertinent diagnoses were discussed with the patient. Patient was given return precautions   Nichele Slawson, MD 07/10/19 0240

## 2019-07-14 DIAGNOSIS — Z01818 Encounter for other preprocedural examination: Secondary | ICD-10-CM | POA: Diagnosis not present

## 2019-08-18 DIAGNOSIS — Z1159 Encounter for screening for other viral diseases: Secondary | ICD-10-CM | POA: Diagnosis not present

## 2019-08-21 DIAGNOSIS — Z1211 Encounter for screening for malignant neoplasm of colon: Secondary | ICD-10-CM | POA: Diagnosis not present

## 2019-09-04 IMAGING — CR DG CERVICAL SPINE 2 OR 3 VIEWS
3 series · 3 of 3 positions shown · non-contrast
Comparison: None.

CLINICAL DATA: Neck stiffness for the past 6 weeks. No known
trauma.

EXAM:
CERVICAL SPINE - 2-3 VIEW

[w cervical spine lat]
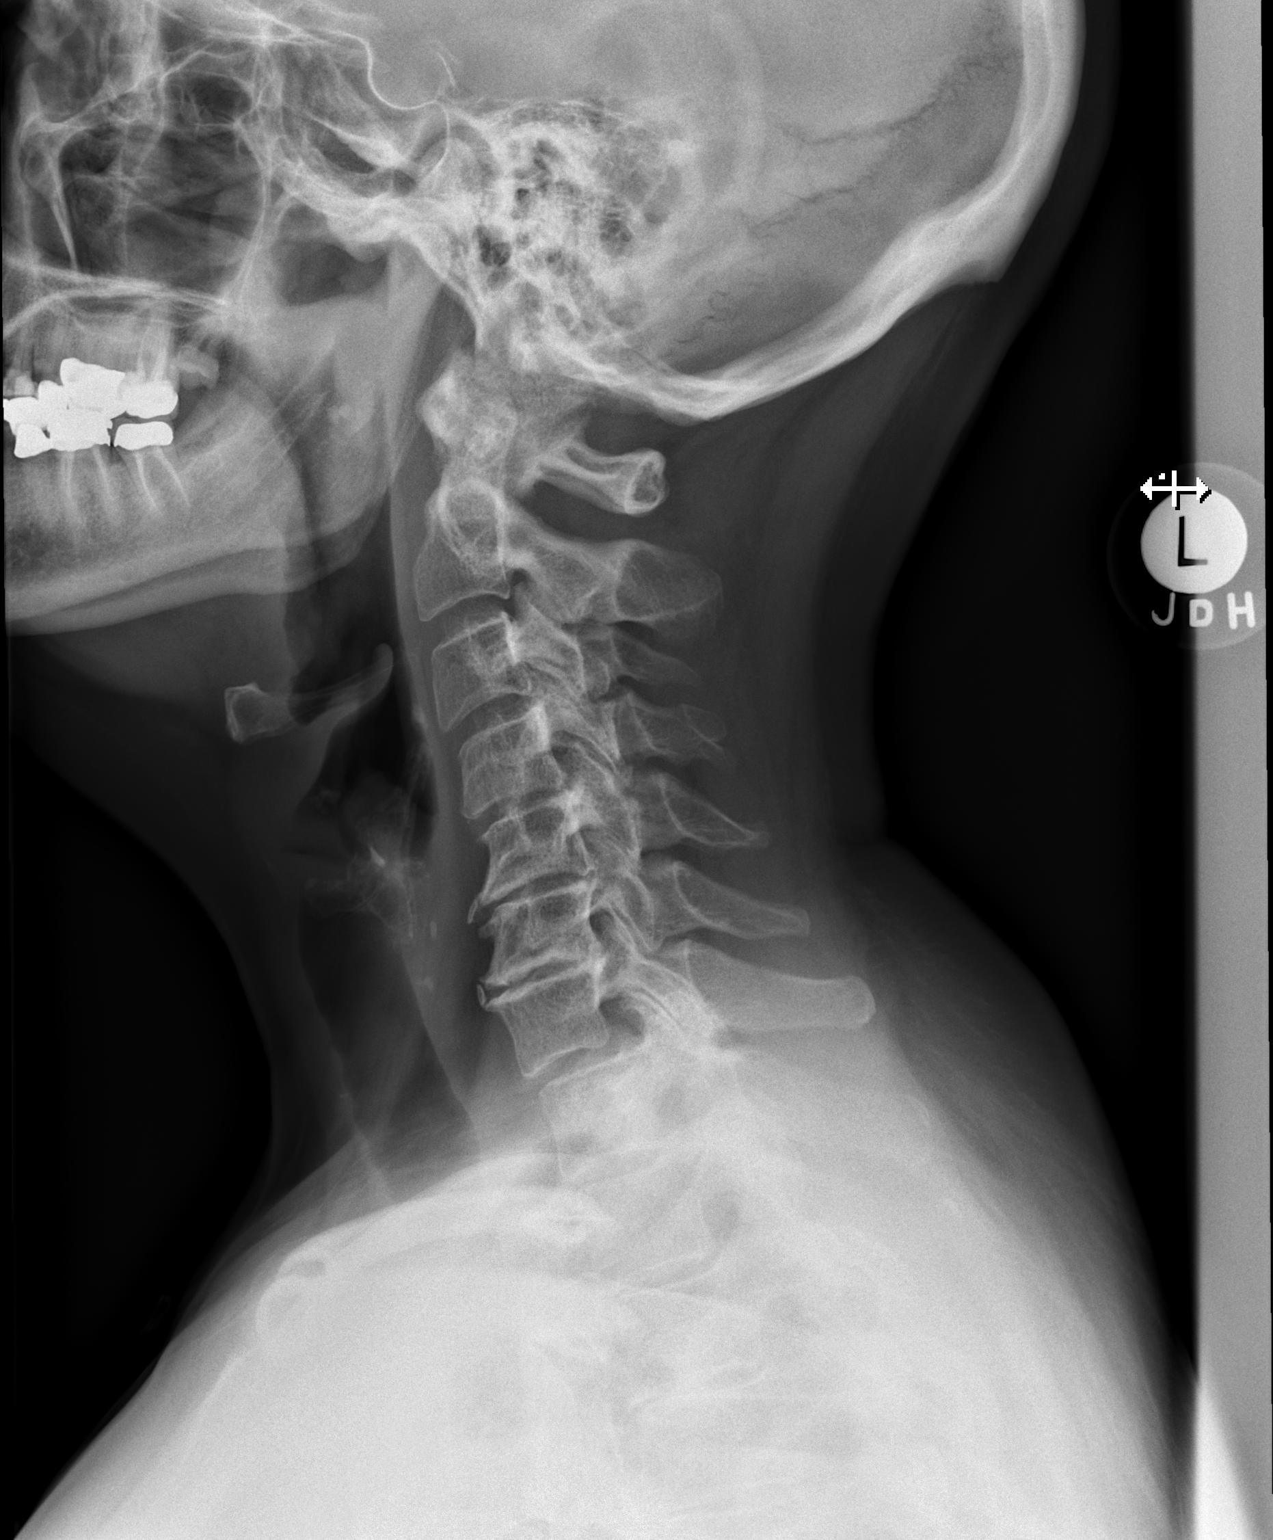

[w cervical spine ap]
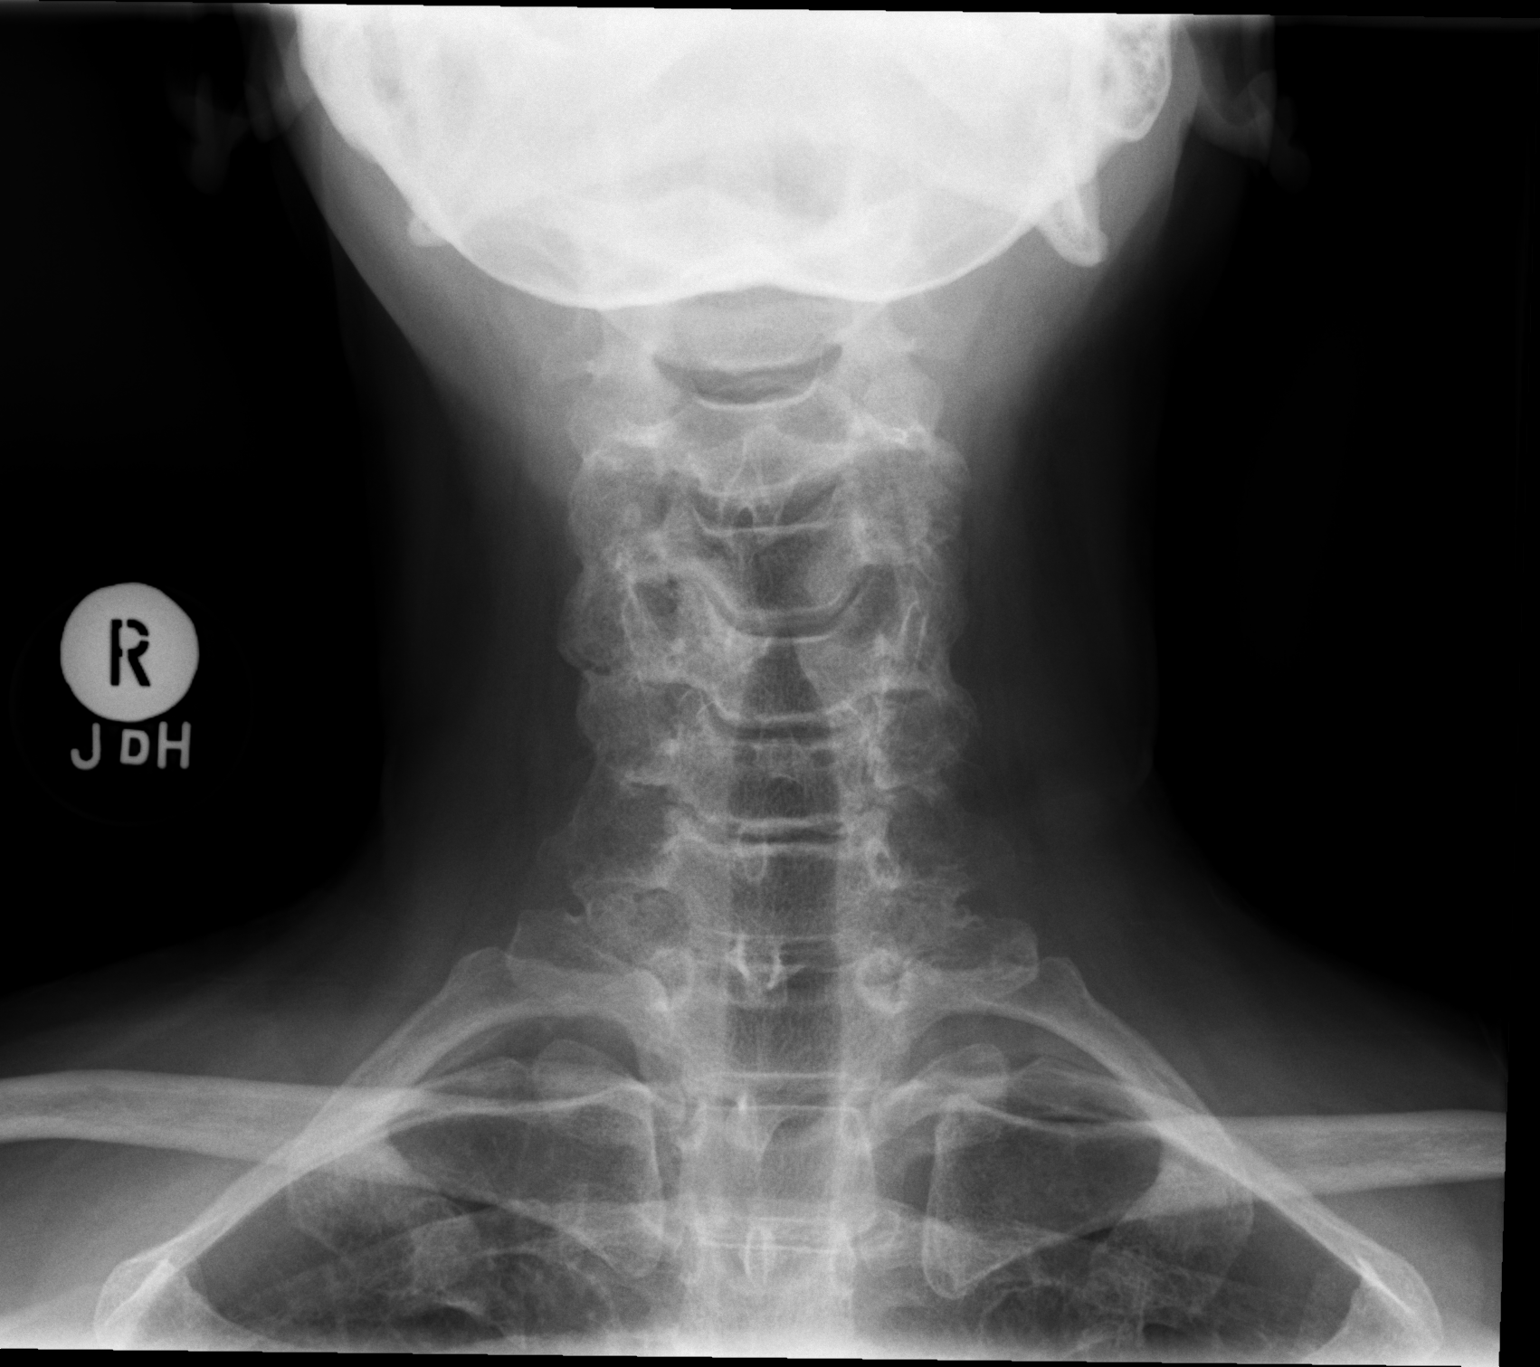

[w cervical spine odontoid]
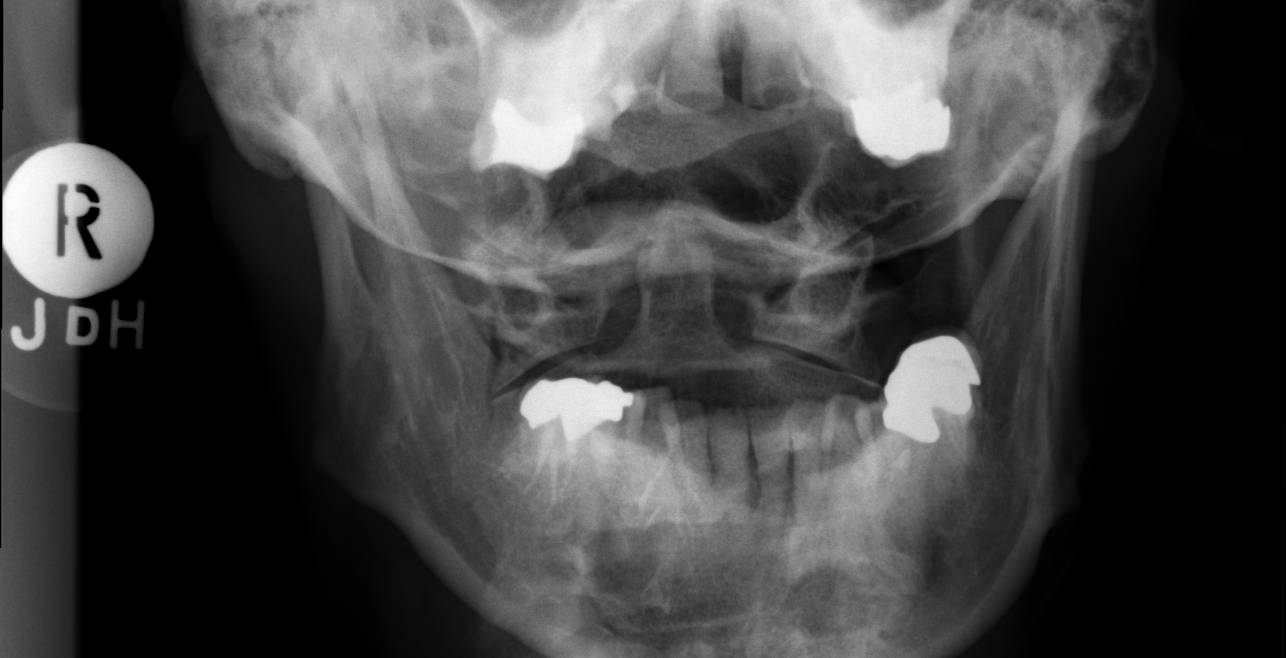

[3 of 3 positions shown; findings below may reference images not displayed]

FINDINGS: The cervical vertebral bodies are preserved in height. There is mild
loss of the normal cervical lordosis. There is grade 1
anterolisthesis of C7 with respect to T1 with the degree of slippage
amounting to approximately 5 mm. There is mild disc space narrowing
at C4-5, C5-6, and C6-7 with minimal narrowing at C7-T1. There is
facet joint hypertrophy at C7-T1. There are anterior near bridging
osteophytes at C5-6 and C6-7. The spinous processes are intact as is
the odontoid. The prevertebral soft tissue spaces are normal.
IMPRESSION: There is degenerative disc disease from C4 through T1. There is 5 mm
of grade 1 anterolisthesis of C7 with respect to T1 likely on the
basis of degenerative disc and facet joint change. There is no
compression fracture.

## 2019-09-08 DIAGNOSIS — J3081 Allergic rhinitis due to animal (cat) (dog) hair and dander: Secondary | ICD-10-CM | POA: Diagnosis not present

## 2019-09-08 DIAGNOSIS — J301 Allergic rhinitis due to pollen: Secondary | ICD-10-CM | POA: Diagnosis not present

## 2019-09-08 DIAGNOSIS — T781XXD Other adverse food reactions, not elsewhere classified, subsequent encounter: Secondary | ICD-10-CM | POA: Diagnosis not present

## 2019-09-08 DIAGNOSIS — Z91012 Allergy to eggs: Secondary | ICD-10-CM | POA: Diagnosis not present

## 2019-09-10 DIAGNOSIS — Z23 Encounter for immunization: Secondary | ICD-10-CM | POA: Diagnosis not present

## 2019-09-16 DIAGNOSIS — R1013 Epigastric pain: Secondary | ICD-10-CM | POA: Diagnosis not present

## 2020-02-26 ENCOUNTER — Ambulatory Visit: Payer: Self-pay | Attending: Internal Medicine

## 2020-02-26 DIAGNOSIS — Z23 Encounter for immunization: Secondary | ICD-10-CM

## 2020-02-26 NOTE — Progress Notes (Signed)
   Covid-19 Vaccination Clinic  Name:  KAITYLN KALLSTROM    MRN: [id number] DOB: 1956-09-18  02/26/2020  Ms. Kempton was observed post Covid-19 immunization for 15 minutes without incident. She was provided with Vaccine Information Sheet and instruction to access the V-Safe system.   Ms. Kawahara was instructed to call 911 with any severe reactions post vaccine: Marland Kitchen Difficulty breathing  . Swelling of face and throat  . A fast heartbeat  . A bad rash all over body  . Dizziness and weakness   Immunizations Administered    Name Date Dose VIS Date Route   Moderna COVID-19 Vaccine 02/26/2020  8:14 AM 0.5 mL 10/14/2019 Intramuscular   Manufacturer: Moderna   Lot: [id number]   NDC: 69450-388-82

## 2020-03-23 ENCOUNTER — Telehealth: Payer: Self-pay | Admitting: Gastroenterology

## 2020-03-23 NOTE — Telephone Encounter (Signed)
We received the records for this patient. Patient is requesting Dr. Lavon Paganini. Patient is being referred for abdominal pain. Records will be sent to Dr. Lavon Paganini for her review.

## 2020-03-30 ENCOUNTER — Ambulatory Visit: Payer: Medicare Other | Attending: Internal Medicine

## 2020-03-30 ENCOUNTER — Ambulatory Visit: Payer: Self-pay

## 2020-03-30 DIAGNOSIS — Z23 Encounter for immunization: Secondary | ICD-10-CM

## 2020-03-30 NOTE — Progress Notes (Signed)
   Covid-19 Vaccination Clinic  Name:  [name]    MRN: [id number] DOB: 03-15-1956  03/30/2020  [name] was observed post Covid-19 immunization for 15 minutes without incident. She was provided with Vaccine Information Sheet and instruction to access the V-Safe system.   [name] was instructed to call 911 with any severe reactions post vaccine: Marland Kitchen Difficulty breathing  . Swelling of face and throat  . A fast heartbeat  . A bad rash all over body  . Dizziness and weakness   Immunizations Administered    Name Date Dose VIS Date Route   Moderna COVID-19 Vaccine 03/30/2020  9:07 AM 0.5 mL 10/2019 Intramuscular   Manufacturer: Moderna   Lot: [id number]   NDC: 27517-001-74

## 2020-04-22 ENCOUNTER — Encounter: Payer: Self-pay | Admitting: Gastroenterology

## 2020-06-11 ENCOUNTER — Ambulatory Visit (INDEPENDENT_AMBULATORY_CARE_PROVIDER_SITE_OTHER): Payer: Medicare Other | Admitting: Gastroenterology

## 2020-06-11 ENCOUNTER — Encounter: Payer: Self-pay | Admitting: Gastroenterology

## 2020-06-11 VITALS — BP 110/70 | HR 86 | Ht 66.0 in | Wt 189.9 lb

## 2020-06-11 DIAGNOSIS — K219 Gastro-esophageal reflux disease without esophagitis: Secondary | ICD-10-CM

## 2020-06-11 DIAGNOSIS — R1013 Epigastric pain: Secondary | ICD-10-CM | POA: Diagnosis not present

## 2020-06-11 MED ORDER — OMEPRAZOLE 20 MG PO CPDR: 20.0000 mg | DELAYED_RELEASE_CAPSULE | Freq: Every day | ORAL | 3 refills | Status: AC

## 2020-06-11 NOTE — Progress Notes (Signed)
Lori Strickland    [id number]    08/08/1956  Primary Care Physician:White, Aram Beecham, MD  Referring Physician: Laurann Montana, MD [phone] [place],  [place] [address]   Chief complaint: Epigastric abdominal pain HPI: 64 year old very pleasant lady here for new patient visit with complaints of epigastric abdominal pain radiating to the back progressively worse in the past 1 to 2 years. She was previously followed by [facility] Dr. Evette Cristal. Colonoscopy 08/21/2019 by Dr. Elwin Sleight   She has not had an EGD.  Denies any dysphagia or odynophagia. She has pain sometimes under her rib cage that radiates to the back, is worse after meals occasionally.  No association with bowel habits.  No recent weight loss. No family history of GI malignancy  No history of gallstones, no recent imaging.  Outpatient Encounter Medications as of 06/11/2020  Medication Sig  . b complex vitamins tablet Take 1 tablet by mouth daily.  . calcium carbonate (OS-CAL - DOSED IN MG OF ELEMENTAL CALCIUM) 1250 (500 Ca) MG tablet Take 1 tablet by mouth.  . cholecalciferol (VITAMIN D) 1000 units tablet Take 1,000 Units by mouth daily.  Marland Kitchen ELDERBERRY PO Take 1 capsule by mouth as needed.  Marland Kitchen EPINEPHrine (EPIPEN JR) 0.15 MG/0.3ML injection Inject into the muscle.  . ferrous sulfate 325 (65 FE) MG EC tablet Take 325 mg by mouth 3 (three) times daily with meals.  Marland Kitchen loratadine-pseudoephedrine (CLARITIN-D 24-HOUR) 10-240 MG 24 hr tablet Take 1 tablet by mouth daily.  . Multiple Vitamin (MULTIVITAMIN WITH MINERALS) TABS tablet Take 1 tablet by mouth daily.  . Omega-3 Fatty Acids (FISH OIL) 1000 MG CAPS Take by mouth.  Marland Kitchen omeprazole (PRILOSEC) 40 MG capsule Take 40 mg by mouth daily.  . vitamin E 400 UNIT capsule Take 400 Units by mouth daily.  . [DISCONTINUED] HYDROcodone-acetaminophen (NORCO) 5-325 MG tablet Take 1 tablet by mouth every 6 (six) hours as needed for moderate pain.  . [DISCONTINUED]  methocarbamol (ROBAXIN) 500 MG tablet Take 1 tablet (500 mg total) by mouth 2 (two) times daily.   No facility-administered encounter medications on file as of 06/11/2020.    Allergies as of 06/11/2020 - Review Complete 06/11/2020  Allergen Reaction Noted  . Eggs or egg-derived products Swelling 09/13/2016  . Ampicillin Itching 09/13/2016  . Erythromycin Diarrhea 09/13/2016    Past Medical History:  Diagnosis Date  . Anemia   . Anxiety   . Arrhythmia   . Arthritis   . Ganglion cyst of wrist, right   . GERD (gastroesophageal reflux disease)   . Obesity   . Seasonal allergies     Past Surgical History:  Procedure Laterality Date  . ABDOMINAL HYSTERECTOMY  1991  . COLON SURGERY    . DIAGNOSTIC LAPAROSCOPY     cysts on ovaries  . MASS EXCISION Right 09/19/2016   Procedure: EXCISION volar cyst right wrist;  Surgeon: Cindee Salt, MD;  Location: [facility];  Service: Orthopedics;  Laterality: Right;  . TUBAL LIGATION      Family History  Problem Relation Age of Onset  . Thyroid disease Mother   . Hypertension Father   . CAD Father     Social History   Socioeconomic History  . Marital status: Married    Spouse name: Not on file  . Number of children: Not on file  . Years of education: Not on file  . Highest education level: Not on file  Occupational History  . Not on file  Tobacco Use  . Smoking status: Never Smoker  . Smokeless tobacco: Never Used  Vaping Use  . Vaping Use: Never used  Substance and Sexual Activity  . Alcohol use: No  . Drug use: No  . Sexual activity: Not on file  Other Topics Concern  . Not on file  Social History Narrative  . Not on file   Social Determinants of Health   Financial Resource Strain:   . Difficulty of Paying Living Expenses:   Food Insecurity:   . Worried About Programme researcher, broadcasting/film/video in the Last Year:   . Barista in the Last Year:   Transportation Needs:   . Freight forwarder (Medical):   Marland Kitchen  Lack of Transportation (Non-Medical):   Physical Activity:   . Days of Exercise per Week:   . Minutes of Exercise per Session:   Stress:   . Feeling of Stress :   Social Connections:   . Frequency of Communication with Friends and Family:   . Frequency of Social Gatherings with Friends and Family:   . Attends Religious Services:   . Active Member of Clubs or Organizations:   . Attends Banker Meetings:   Marland Kitchen Marital Status:   Intimate Partner Violence:   . Fear of Current or Ex-Partner:   . Emotionally Abused:   Marland Kitchen Physically Abused:   . Sexually Abused:       Review of systems: All other review of systems negative except as mentioned in the HPI.   Physical Exam: Vitals:   06/11/20 1344  BP: 110/70  Pulse: 86   Body mass index is 30.65 kg/m. Gen:      No acute distress HEENT:   sclera anicteric Abd:      + bowel sounds; soft, non-tender; no palpable masses, no distension Ext:    No edema Neuro: alert and oriented x 3 Psych: normal mood and affect  Data Reviewed:  Reviewed labs, radiology imaging, old records and pertinent past GI work up   Assessment and Plan/Recommendations:  64 year old very pleasant lady here for new patient visit with complaints of epigastric abdominal pain  Schedule for EGD to exclude peptic ulcer disease, erosive gastritis or H. pylori associated dyspepsia  Start omeprazole 20 mg daily, 30 minutes before breakfast  Abdominal right upper quadrant ultrasound to exclude gallbladder disease or cholelithiasis  The risks and benefits as well as alternatives of endoscopic procedure(s) have been discussed and reviewed. All questions answered. The patient agrees to proceed.  Return in 3 months or sooner if needed   The patient was provided an opportunity to ask questions and all were answered. The patient agreed with the plan and demonstrated an understanding of the instructions.  Iona Beard , MD    CC: Laurann Montana,  MD

## 2020-06-11 NOTE — Patient Instructions (Signed)
You have been scheduled for an abdominal ultrasound at [facility] (1st floor of hospital) on  8/3 at 2:30pm. Please arrive 15 minutes prior to your appointment for registration. Make certain not to have anything to eat or drink 8  hours prior to your appointment. Should you need to reschedule your appointment, please contact radiology at [phone]. This test typically takes about 30 minutes to perform.   We will send Omeprazole 20 mg to your pharmacy   You have been scheduled for an endoscopy. Please follow written instructions given to you at your visit today. If you use inhalers (even only as needed), please bring them with you on the day of your procedure.   Follow up in 3 months after procedure  I appreciate the  opportunity to care for you  Thank You   Marsa Aris , MD

## 2020-06-14 ENCOUNTER — Encounter: Payer: Self-pay | Admitting: Gastroenterology

## 2020-06-15 ENCOUNTER — Other Ambulatory Visit: Payer: Self-pay

## 2020-06-15 ENCOUNTER — Ambulatory Visit (HOSPITAL_COMMUNITY)
Admission: RE | Admit: 2020-06-15 | Discharge: 2020-06-15 | Disposition: A | Discharge status: Home / Self Care | Payer: Medicare Other | Source: Ambulatory Visit | Attending: Gastroenterology | Admitting: Gastroenterology

## 2020-06-15 DIAGNOSIS — R1013 Epigastric pain: Secondary | ICD-10-CM | POA: Diagnosis present

## 2020-06-15 DIAGNOSIS — K219 Gastro-esophageal reflux disease without esophagitis: Secondary | ICD-10-CM | POA: Diagnosis present

## 2020-06-22 ENCOUNTER — Encounter: Payer: Self-pay | Admitting: Gastroenterology

## 2020-06-22 ENCOUNTER — Ambulatory Visit (AMBULATORY_SURGERY_CENTER): Payer: Medicare Other | Admitting: Gastroenterology

## 2020-06-22 ENCOUNTER — Other Ambulatory Visit: Payer: Self-pay

## 2020-06-22 VITALS — BP 102/49 | HR 64 | Temp 97.4°F | Resp 14 | Ht 66.0 in | Wt 189.0 lb

## 2020-06-22 DIAGNOSIS — K2100 Gastro-esophageal reflux disease with esophagitis, without bleeding: Secondary | ICD-10-CM

## 2020-06-22 DIAGNOSIS — K2980 Duodenitis without bleeding: Secondary | ICD-10-CM

## 2020-06-22 DIAGNOSIS — K2970 Gastritis, unspecified, without bleeding: Secondary | ICD-10-CM | POA: Diagnosis present

## 2020-06-22 DIAGNOSIS — R1013 Epigastric pain: Secondary | ICD-10-CM

## 2020-06-22 MED ORDER — SODIUM CHLORIDE 0.9 % IV SOLN: 500.0000 mL | Freq: Once | INTRAVENOUS | Status: DC

## 2020-06-22 NOTE — Patient Instructions (Signed)
Await pathology results from Dr. Lavon Paganini    YOU HAD AN ENDOSCOPIC PROCEDURE TODAY AT THE [facility]:   Refer to the procedure report that was given to you for any specific questions about what was found during the examination.  If the procedure report does not answer your questions, please call your gastroenterologist to clarify.  If you requested that your care partner not be given the details of your procedure findings, then the procedure report has been included in a sealed envelope for you to review at your convenience later.  YOU SHOULD EXPECT: Some feelings of bloating in the abdomen. Passage of more gas than usual.  Walking can help get rid of the air that was put into your GI tract during the procedure and reduce the bloating. If you had a lower endoscopy (such as a colonoscopy or flexible sigmoidoscopy) you may notice spotting of blood in your stool or on the toilet paper. If you underwent a bowel prep for your procedure, you may not have a normal bowel movement for a few days.  Please Note:  You might notice some irritation and congestion in your nose or some drainage.  This is from the oxygen used during your procedure.  There is no need for concern and it should clear up in a day or so.  SYMPTOMS TO REPORT IMMEDIATELY:   Following upper endoscopy (EGD)  Vomiting of blood or coffee ground material  New chest pain or pain under the shoulder blades  Painful or persistently difficult swallowing  New shortness of breath  Fever of 100F or higher  Black, tarry-looking stools  For urgent or emergent issues, a gastroenterologist can be reached at any hour by calling [phone]. Do not use MyChart messaging for urgent concerns.    DIET:  We do recommend a small meal at first, but then you may proceed to your regular diet.  Drink plenty of fluids but you should avoid alcoholic beverages for 24 hours.  ACTIVITY:  You should plan to take it easy for the rest of today and  you should NOT DRIVE or use heavy machinery until tomorrow (because of the sedation medicines used during the test).    FOLLOW UP: Our staff will call the number listed on your records 48-72 hours following your procedure to check on you and address any questions or concerns that you may have regarding the information given to you following your procedure. If we do not reach you, we will leave a message.  We will attempt to reach you two times.  During this call, we will ask if you have developed any symptoms of COVID 19. If you develop any symptoms (ie: fever, flu-like symptoms, shortness of breath, cough etc.) before then, please call [phone].  If you test positive for Covid 19 in the 2 weeks post procedure, please call and report this information to [place].    If any biopsies were taken you will be contacted by phone or by letter within the next 1-3 weeks.  Please call us at [phone] if you have not heard about the biopsies in 3 weeks.    SIGNATURES/CONFIDENTIALITY: You and/or your care partner have signed paperwork which will be entered into your electronic medical record.  These signatures attest to the fact that that the information above on your After Visit Summary has been reviewed and is understood.  Full responsibility of the confidentiality of this discharge information lies with you and/or your care-partner.

## 2020-06-22 NOTE — Progress Notes (Signed)
To pacu, VSS. Report to [email or web address] 

## 2020-06-22 NOTE — Op Note (Signed)
[facility] Patient Name: Lori Strickland Procedure Date: 06/22/2020 8:02 AM MRN: [id number] Endoscopist: Napoleon Form , MD Age: 64 Referring MD:  Date of Birth: [birth year] Gender: Female Account #: [id number] Procedure:                Upper GI endoscopy Indications:              Epigastric abdominal pain, Dyspepsia Medicines:                Monitored Anesthesia Care Procedure:                Pre-Anesthesia Assessment:                           - Prior to the procedure, a History and Physical                            was performed, and patient medications and                            allergies were reviewed. The patient's tolerance of                            previous anesthesia was also reviewed. The risks                            and benefits of the procedure and the sedation                            options and risks were discussed with the patient.                            All questions were answered, and informed consent                            was obtained. Prior Anticoagulants: The patient has                            taken no previous anticoagulant or antiplatelet                            agents. ASA Grade Assessment: II - A patient with                            mild systemic disease. After reviewing the risks                            and benefits, the patient was deemed in                            satisfactory condition to undergo the procedure.                           After obtaining informed consent, the endoscope was  passed under direct vision. Throughout the                            procedure, the patient's blood pressure, pulse, and                            oxygen saturations were monitored continuously. The                            Endoscope was introduced through the mouth, and                            advanced to the second part of duodenum. The upper                            GI endoscopy  was accomplished without difficulty.                            The patient tolerated the procedure well. Scope In: Scope Out: Findings:                 LA Grade A (one or more mucosal breaks less than 5                            mm, not extending between tops of 2 mucosal folds)                            esophagitis with no bleeding was found 34 to 35 cm                            from the incisors.                           Striped mildly erythematous mucosa without bleeding                            was found in the gastric antrum. Biopsies were                            taken with a cold forceps for Helicobacter pylori                            testing.                           Mildly scalloped mucosa was found in the duodenal                            bulb, in the first portion of the duodenum and in                            the second portion of the duodenum. Biopsies were  taken with a cold forceps for histology. Complications:            No immediate complications. Estimated Blood Loss:     Estimated blood loss was minimal. Impression:               - LA Grade A reflux esophagitis with no bleeding.                           - Erythematous mucosa in the antrum. Biopsied.                           - Scalloped mucosa was found in the duodenum, rule                            out celiac disease. Biopsied. Recommendation:           - Resume previous diet.                           - Continue present medications.                           - Await pathology results.                           - No aspirin, ibuprofen, naproxen, or other                            non-steroidal anti-inflammatory drugs.                           - Follow an antireflux regimen indefinitely.                           - Return to GI office in 4 months, next available                            appointment. Please call [phone] to schedule                             appointment. Napoleon Form, MD 06/22/2020 8:30:14 AM This report has been signed electronically.

## 2020-06-22 NOTE — Progress Notes (Signed)
Called to room to assist during endoscopic procedure.  Patient ID and intended procedure confirmed with present staff. Received instructions for my participation in the procedure from the performing physician.  

## 2020-06-22 NOTE — Progress Notes (Signed)
VS-CW 

## 2020-06-24 ENCOUNTER — Telehealth: Payer: Self-pay | Admitting: *Deleted

## 2020-06-24 NOTE — Telephone Encounter (Signed)
°  Follow up Call-  Call back number 06/22/2020  Post procedure Call Back phone  # [phone]  Permission to leave phone message Yes  Some recent data might be hidden     Patient questions:  Do you have a fever, pain , or abdominal swelling? No. Pain Score  0 *  Have you tolerated food without any problems? Yes.    Have you been able to return to your normal activities? Yes.    Do you have any questions about your discharge instructions: Diet   No. Medications  No. Follow up visit  No.  Do you have questions or concerns about your Care? No.  Actions: * If pain score is 4 or above: No action needed, pain <4.  1. Have you developed a fever since your procedure? no  2.   Have you had an respiratory symptoms (SOB or cough) since your procedure? no  3.   Have you tested positive for COVID 19 since your procedure no  4.   Have you had any family members/close contacts diagnosed with the COVID 19 since your procedure?  no   If yes to any of these questions please route to Laverna Peace, RN and Karlton Lemon, RN

## 2020-07-02 ENCOUNTER — Encounter: Payer: Self-pay | Admitting: Gastroenterology

## 2020-07-06 ENCOUNTER — Other Ambulatory Visit: Payer: Self-pay

## 2020-10-04 ENCOUNTER — Ambulatory Visit (INDEPENDENT_AMBULATORY_CARE_PROVIDER_SITE_OTHER): Payer: Medicare Other | Admitting: Podiatry

## 2020-10-04 ENCOUNTER — Encounter: Payer: Self-pay | Admitting: Podiatry

## 2020-10-04 ENCOUNTER — Other Ambulatory Visit: Payer: Self-pay

## 2020-10-04 DIAGNOSIS — L601 Onycholysis: Secondary | ICD-10-CM | POA: Diagnosis not present

## 2020-10-04 DIAGNOSIS — S90222A Contusion of left lesser toe(s) with damage to nail, initial encounter: Secondary | ICD-10-CM

## 2020-10-04 NOTE — Progress Notes (Signed)
  Subjective:  Patient ID: Lori Strickland, female    DOB: [birth year],  MRN: [id number]  Chief Complaint  Patient presents with  . Nail Problem    left foot 2nd nail came half way off last tuesday     64 y.o. female presents with the above complaint. History confirmed with patient.  Said some drainage.  Her last Thursday her doctor put her on Keflex.  Thinks it may been from wearing boots without socks.  Objective:  Physical Exam: warm, good capillary refill, no trophic changes or ulcerative lesions, normal DP and PT pulses and normal sensory exam. Left Foot: Second toenail loose with redness and drainage.  No signs of cellulitis or deep infection Assessment:   1. Contusion of lesser toe of left foot with damage to nail, initial encounter   2. Onycholysis      Plan:  Patient was evaluated and treated and all questions answered.   Avulsion of damaged toenail left second -Patient elects to proceed with minor surgery to remove toenail today. Consent reviewed and signed by patient. -Left second nail excised. See procedure note. -Educated on post-procedure care including soaking. Written instructions provided and reviewed.   Procedure: Excision of Toenail Location: Left 2nd toe  nail . Anesthesia: Lidocaine 1% plain; 1.5 mL and Marcaine 0.5% plain; 1.5 mL, digital block. Skin Prep: Betadine. Dressing: Silvadene; telfa; dry, sterile, compression dressing. Technique: Following skin prep, the toe was exsanguinated and a tourniquet was secured at the base of the toe. The affected nail border was freed, and excised.The tourniquet was then removed and sterile dressing applied. Disposition: Patient tolerated procedure well.      Return if symptoms worsen or fail to improve.

## 2020-10-04 NOTE — Patient Instructions (Signed)

## 2021-06-14 IMAGING — US US ABDOMEN LIMITED
1 series · 14 of 25 positions shown · non-contrast
Comparison: None.

CLINICAL DATA: Epigastric pain

EXAM:
ULTRASOUND ABDOMEN LIMITED RIGHT UPPER QUADRANT

[Series 1: us abdomen limited · 14 of 90 slices shown]
[im 1/90]
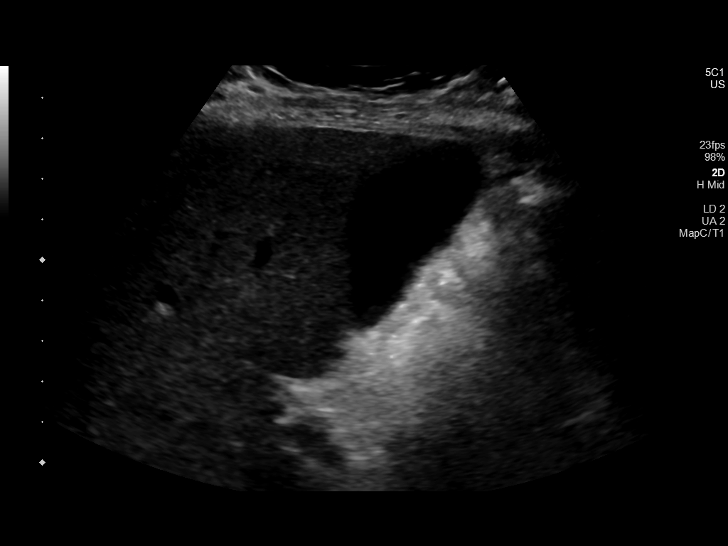
[im 8/90]
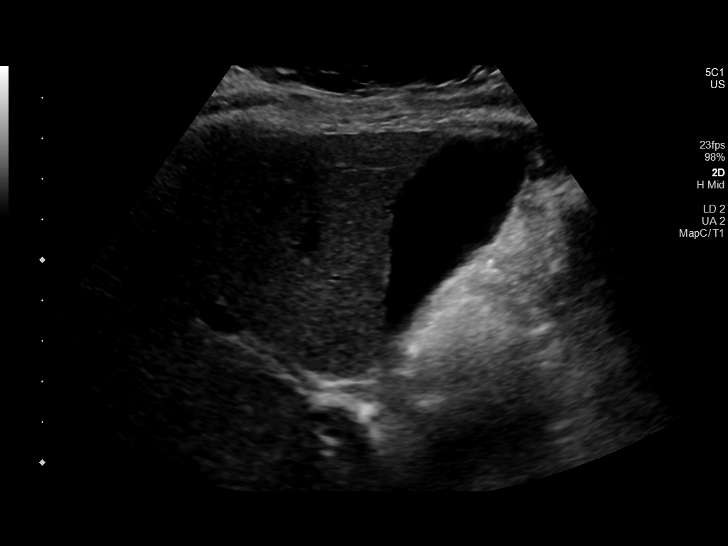
[im 15/90]
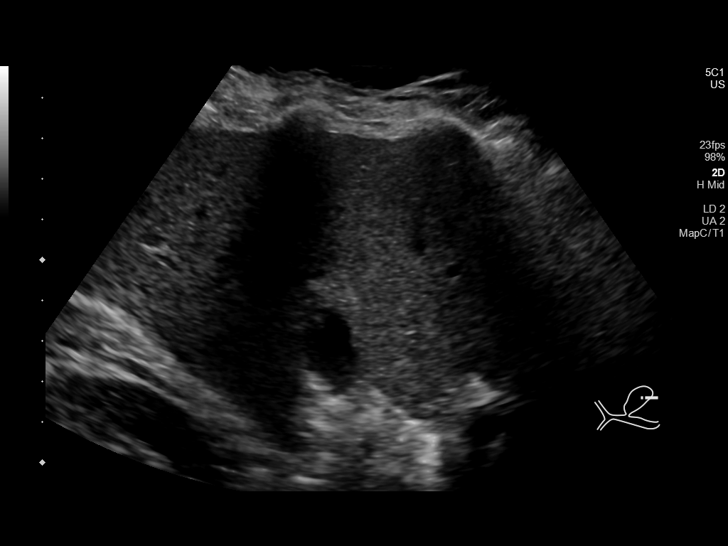
[im 23/90]
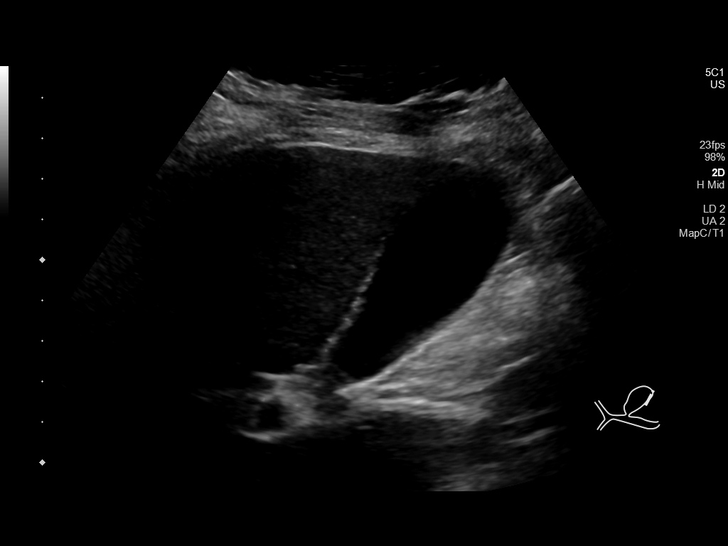
[im 30/90]
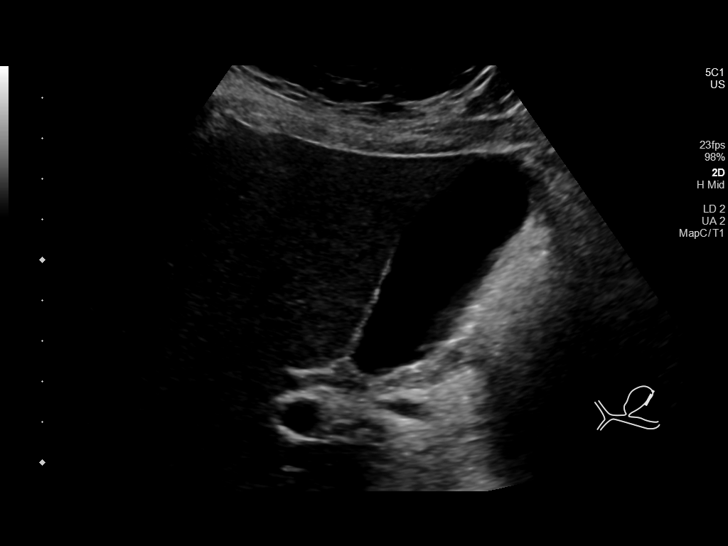
[im 34/90]
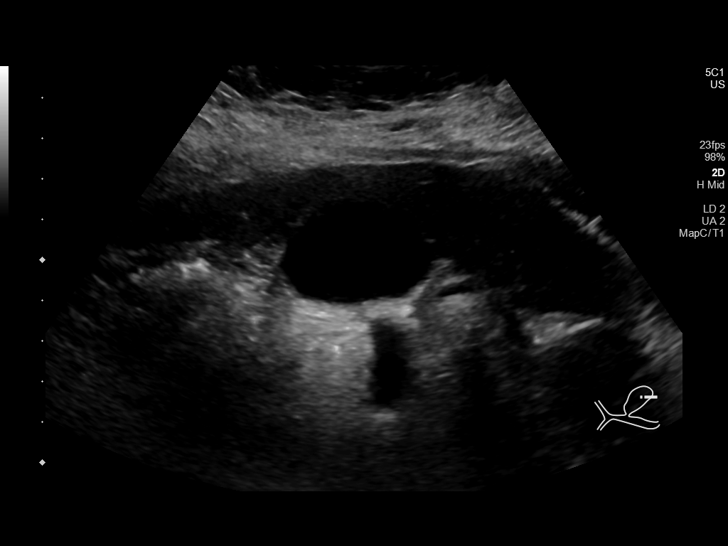
[im 41/90]
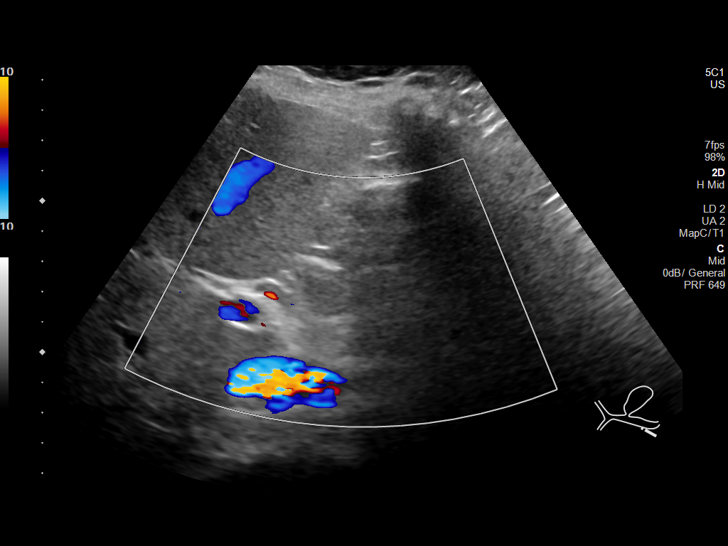
[im 49/90]
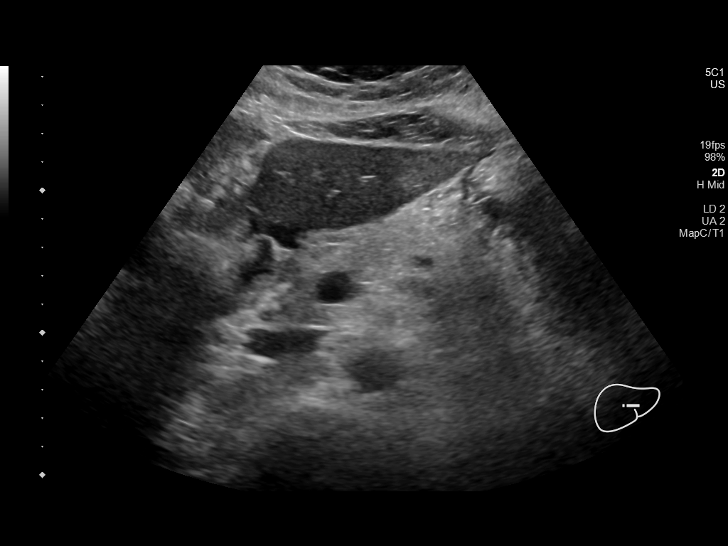
[im 56/90]
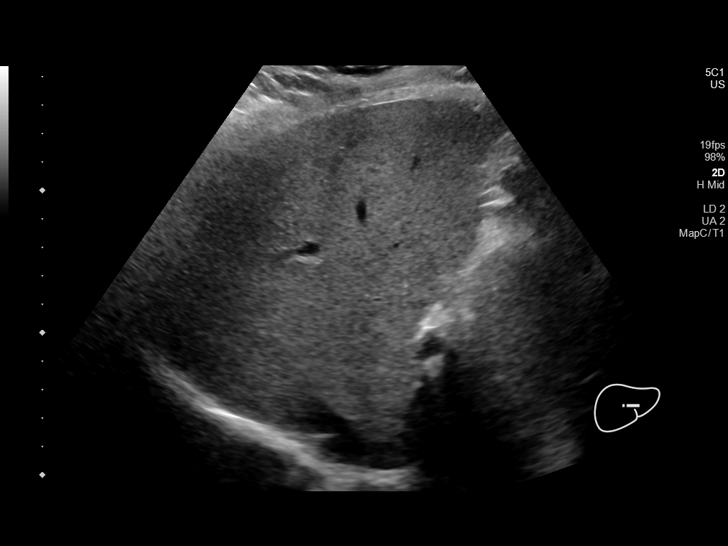
[im 60/90]
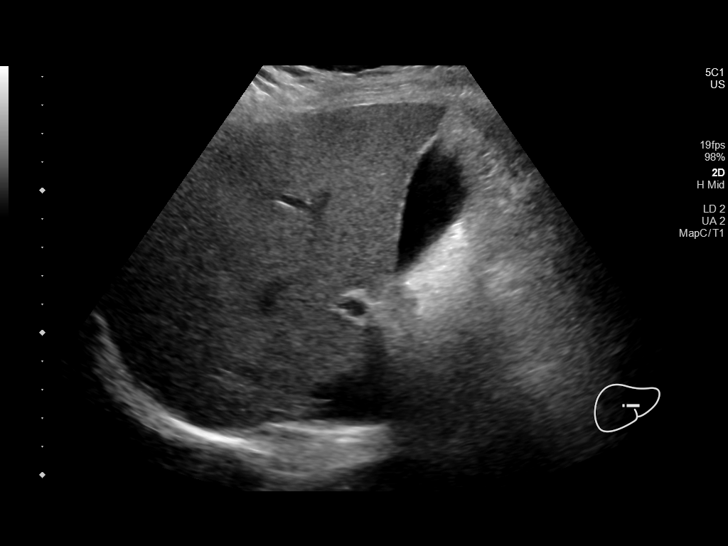
[im 67/90]
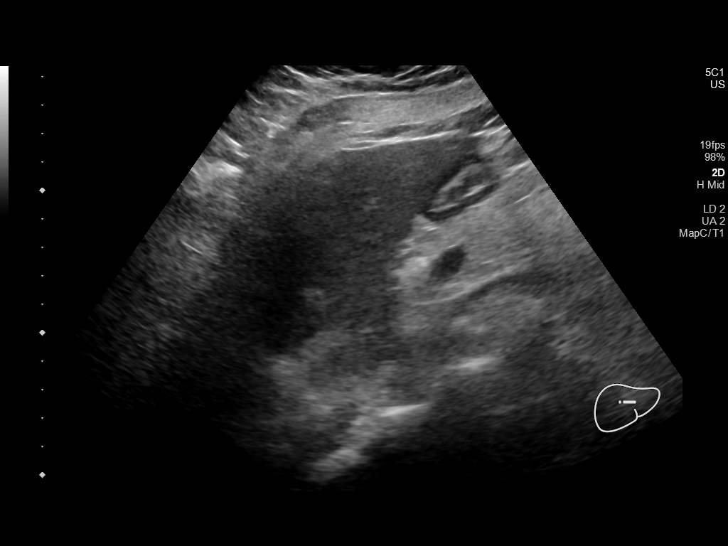
[im 75/90]
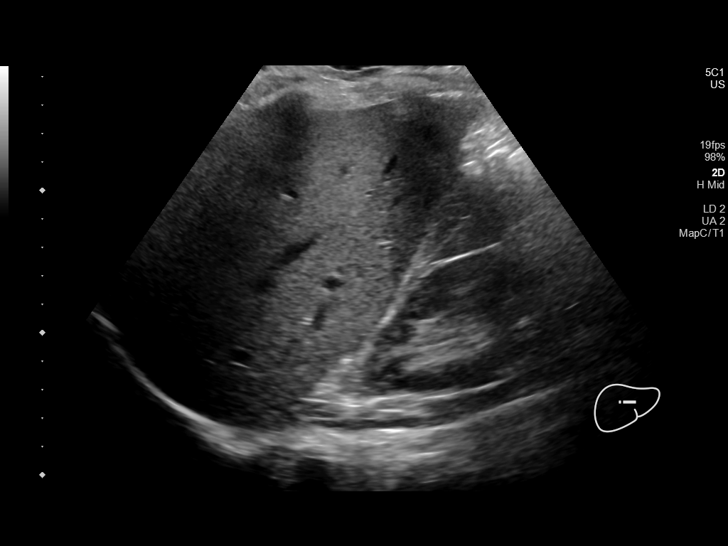
[im 82/90]
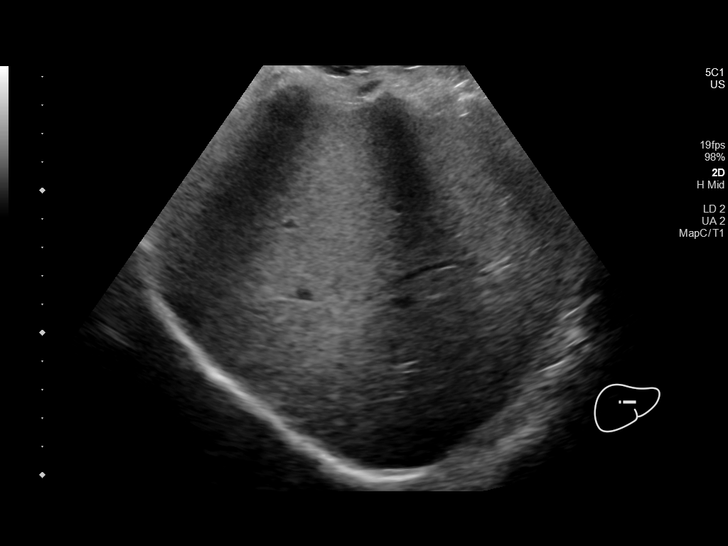
[im 90/90]
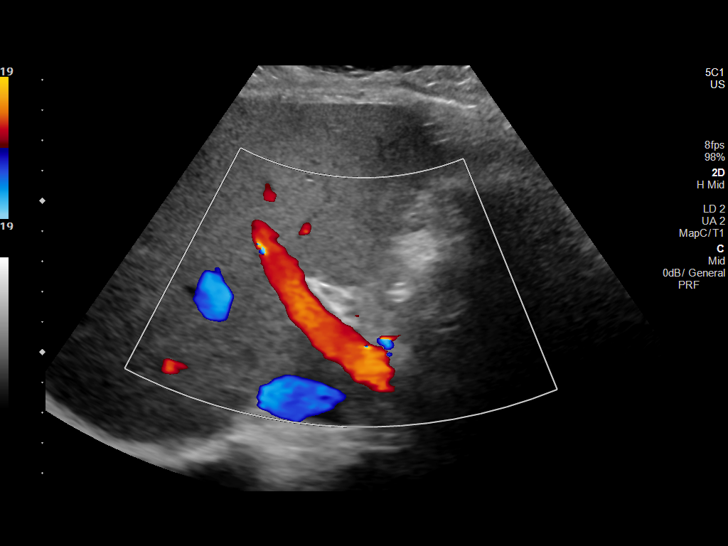

[14 of 25 positions shown; findings below may reference images not displayed]

FINDINGS: Gallbladder:

No gallstones or wall thickening visualized. No sonographic Murphy
sign noted by sonographer.

Common bile duct:

Diameter: 4.2 mm

Liver:

Liver is slightly echogenic. No focal hepatic abnormality. Portal
vein is patent on color Doppler imaging with normal direction of
blood flow towards the liver.

Other: None.
IMPRESSION: 1. Negative for gallstones or biliary dilatation
2. Slightly echogenic liver suggesting steatosis

## 2022-09-13 ENCOUNTER — Other Ambulatory Visit: Payer: Self-pay | Admitting: Physician Assistant

## 2022-09-13 DIAGNOSIS — H93A1 Pulsatile tinnitus, right ear: Secondary | ICD-10-CM

## 2022-10-04 ENCOUNTER — Ambulatory Visit
Admission: RE | Admit: 2022-10-04 | Discharge: 2022-10-04 | Disposition: A | Discharge status: Home / Self Care | Payer: Medicare Other | Source: Ambulatory Visit | Attending: Physician Assistant | Admitting: Physician Assistant

## 2022-10-04 DIAGNOSIS — H93A1 Pulsatile tinnitus, right ear: Secondary | ICD-10-CM

## 2022-10-04 MED ORDER — GADOPICLENOL 0.5 MMOL/ML IV SOLN
7.0000 mL | Freq: Once | INTRAVENOUS | Status: AC | PRN
  Administered 2022-10-04: 7 mL via INTRAVENOUS
